# Patient Record
Sex: Male | Born: 1950 | Race: White | Hispanic: No | Marital: Married | Smoking: Never smoker
Health system: Southern US, Community
[De-identification: ages and names within clinical notes are randomized; demographics above are authoritative.]

## PROBLEM LIST (undated history)

## (undated) DIAGNOSIS — J189 Pneumonia, unspecified organism: Secondary | ICD-10-CM

## (undated) DIAGNOSIS — E119 Type 2 diabetes mellitus without complications: Secondary | ICD-10-CM

## (undated) DIAGNOSIS — Z87442 Personal history of urinary calculi: Secondary | ICD-10-CM

## (undated) DIAGNOSIS — M1990 Unspecified osteoarthritis, unspecified site: Secondary | ICD-10-CM

## (undated) DIAGNOSIS — E785 Hyperlipidemia, unspecified: Secondary | ICD-10-CM

## (undated) DIAGNOSIS — I10 Essential (primary) hypertension: Secondary | ICD-10-CM

## (undated) HISTORY — PX: TONSILLECTOMY: SUR1361

## (undated) HISTORY — PX: HERNIA REPAIR: SHX51

## (undated) HISTORY — PX: BLADDER STONE REMOVAL: SHX568

## (undated) HISTORY — PX: FRACTURE SURGERY: SHX138

## (undated) HISTORY — PX: LITHOTRIPSY: SUR834

---

## 1998-07-11 ENCOUNTER — Encounter: Payer: Self-pay | Admitting: Urology

## 1998-07-13 ENCOUNTER — Ambulatory Visit (HOSPITAL_COMMUNITY): Admission: RE | Admit: 1998-07-13 | Discharge: 1998-07-13 | Payer: Self-pay | Admitting: Urology

## 2000-05-07 ENCOUNTER — Encounter: Payer: Self-pay | Admitting: Urology

## 2000-05-08 ENCOUNTER — Ambulatory Visit (HOSPITAL_COMMUNITY): Admission: RE | Admit: 2000-05-08 | Discharge: 2000-05-08 | Payer: Self-pay | Admitting: Urology

## 2002-06-04 ENCOUNTER — Encounter: Payer: Self-pay | Admitting: Surgery

## 2002-06-04 ENCOUNTER — Ambulatory Visit (HOSPITAL_COMMUNITY): Admission: RE | Admit: 2002-06-04 | Discharge: 2002-06-04 | Payer: Self-pay | Admitting: Surgery

## 2012-11-01 DIAGNOSIS — I10 Essential (primary) hypertension: Secondary | ICD-10-CM | POA: Diagnosis present

## 2016-03-21 ENCOUNTER — Other Ambulatory Visit: Payer: Self-pay | Admitting: Urology

## 2016-04-18 ENCOUNTER — Other Ambulatory Visit: Payer: Self-pay | Admitting: Urology

## 2016-06-28 ENCOUNTER — Other Ambulatory Visit: Payer: Self-pay | Admitting: Urology

## 2016-07-02 NOTE — Patient Instructions (Addendum)
Steffanie RainwaterJames S Ebers  07/02/2016   Your procedure is scheduled on: 07-08-16  [facility]  Entrance [place]  elevators to 3rd floor to  Short Stay Center at [facility]9AM.   Call this number if you have problems the morning of surgery [phone]   Remember: ONLY 1 PERSON MAY GO WITH YOU TO SHORT STAY TO GET  READY MORNING OF YOUR SURGERY.  Do not eat food or drink liquids :After Midnight.     Take these medicines the morning of surgery with A SIP OF WATER: atorvastatin(lipitor), zetia   DO NOT TAKE ANY DIABETIC MEDICATIONS DAY OF YOUR SURGERY                               You may not have any metal on your body including hair pins and              piercings  Do not wear jewelry, make-up, lotions, powders or perfumes, deodorant                Men may shave face and neck.   Do not bring valuables to the hospital. [place] IS NOT             RESPONSIBLE   FOR VALUABLES.  Contacts, dentures or bridgework may not be worn into surgery.      Patients discharged the day of surgery will not be allowed to drive home.  Name and phone number of your driver:  Special Instructions: N/A              Please read over the following fact sheets you were given: _____________________________________________________________________           How to Manage Your Diabetes Before and After Surgery  Why is it important to control my blood sugar before and after surgery? . Improving blood sugar levels before and after surgery helps healing and can limit problems. . A way of improving blood sugar control is eating a healthy diet by: o  Eating less sugar and carbohydrates o  Increasing activity/exercise o  Talking with your doctor about reaching your blood sugar goals . High blood sugars (greater than 180 mg/dL) can raise your risk of infections and slow your recovery, so you will need to focus on controlling your diabetes during the weeks before surgery. . Make sure that  the doctor who takes care of your diabetes knows about your planned surgery including the date and location.  How do I manage my blood sugar before surgery? . Check your blood sugar at least 4 times a day, starting 2 days before surgery, to make sure that the level is not too high or low. o Check your blood sugar the morning of your surgery when you wake up and every 2 hours until you get to the Short Stay unit. . If your blood sugar is less than 70 mg/dL, you will need to treat for low blood sugar: o Do not take insulin. o Treat a low blood sugar (less than 70 mg/dL) with  cup of clear juice (cranberry or apple), 4 glucose tablets, OR glucose gel. o Recheck blood sugar in 15 minutes after treatment (to make sure it is greater than 70 mg/dL). If your blood sugar is not greater than 70 mg/dL on recheck, call [phone] for further instructions. . Report  your blood sugar to the short stay nurse when you get to Short Stay.  . If you are admitted to the hospital after surgery: o Your blood sugar will be checked by the staff and you will probably be given insulin after surgery (instead of oral diabetes medicines) to make sure you have good blood sugar levels. o The goal for blood sugar control after surgery is 80-180 mg/dL.   WHAT DO I DO ABOUT MY DIABETES MEDICATION?   . THE DAY BEFORE SURGERY, take  METFORMIN as usual       . THE MORNING OF SURGERY, Do not take oral diabetes medicines (pills)  Patient Signature:  Date:   Nurse Signature:  Date:   Reviewed and Endorsed by [facility] Patient Education Committee, August 2015  [facility] - Preparing for Surgery Before surgery, you can play an important role.  Because skin is not sterile, your skin needs to be as free of germs as possible.  You can reduce the number of germs on your skin by washing with CHG (chlorahexidine gluconate) soap before surgery.  CHG is an antiseptic cleaner which kills germs and bonds with the skin to continue  killing germs even after washing. Please DO NOT use if you have an allergy to CHG or antibacterial soaps.  If your skin becomes reddened/irritated stop using the CHG and inform your nurse when you arrive at Short Stay. Do not shave (including legs and underarms) for at least 48 hours prior to the first CHG shower.  You may shave your face/neck. Please follow these instructions carefully:  1.  Shower with CHG Soap the night before surgery and the  morning of Surgery.  2.  If you choose to wash your hair, wash your hair first as usual with your  normal  shampoo.  3.  After you shampoo, rinse your hair and body thoroughly to remove the  shampoo.                           4.  Use CHG as you would any other liquid soap.  You can apply chg directly  to the skin and wash                       Gently with a scrungie or clean washcloth.  5.  Apply the CHG Soap to your body ONLY FROM THE NECK DOWN.   Do not use on face/ open                           Wound or open sores. Avoid contact with eyes, ears mouth and genitals (private parts).                       Wash face,  Genitals (private parts) with your normal soap.             6.  Wash thoroughly, paying special attention to the area where your surgery  will be performed.  7.  Thoroughly rinse your body with warm water from the neck down.  8.  DO NOT shower/wash with your normal soap after using and rinsing off  the CHG Soap.                9.  Pat yourself dry with a clean towel.            10.  Wear  clean pajamas.            11.  Place clean sheets on your bed the night of your first shower and do not  sleep with pets. Day of Surgery : Do not apply any lotions/deodorants the morning of surgery.  Please wear clean clothes to the hospital/surgery center.  FAILURE TO FOLLOW THESE INSTRUCTIONS MAY RESULT IN THE CANCELLATION OF YOUR SURGERY   _______________________________________________________________________

## 2016-07-02 NOTE — Progress Notes (Signed)
LOV Zoe LanPenny Jones FNP 11-27-15 on chart mentions "hx of stones"..."F/U urology "

## 2016-07-03 ENCOUNTER — Encounter (HOSPITAL_COMMUNITY)
Admission: RE | Admit: 2016-07-03 | Discharge: 2016-07-03 | Disposition: A | Discharge status: Home / Self Care | Payer: PRIVATE HEALTH INSURANCE | Source: Ambulatory Visit | Attending: Urology | Admitting: Urology

## 2016-07-03 ENCOUNTER — Encounter (HOSPITAL_COMMUNITY): Payer: Self-pay

## 2016-07-03 DIAGNOSIS — Z01810 Encounter for preprocedural cardiovascular examination: Secondary | ICD-10-CM | POA: Diagnosis present

## 2016-07-03 DIAGNOSIS — I10 Essential (primary) hypertension: Secondary | ICD-10-CM | POA: Diagnosis not present

## 2016-07-03 DIAGNOSIS — Z01812 Encounter for preprocedural laboratory examination: Secondary | ICD-10-CM | POA: Insufficient documentation

## 2016-07-03 HISTORY — DX: Type 2 diabetes mellitus without complications: E11.9

## 2016-07-03 HISTORY — DX: Unspecified osteoarthritis, unspecified site: M19.90

## 2016-07-03 HISTORY — DX: Hyperlipidemia, unspecified: E78.5

## 2016-07-03 HISTORY — DX: Essential (primary) hypertension: I10

## 2016-07-03 HISTORY — DX: Personal history of urinary calculi: Z87.442

## 2016-07-03 HISTORY — DX: Pneumonia, unspecified organism: J18.9

## 2016-07-03 LAB — BASIC METABOLIC PANEL
Anion gap: 10 (ref 5–15)
BUN: 23 mg/dL — AB (ref 6–20)
CHLORIDE: 105 mmol/L (ref 101–111)
CO2: 24 mmol/L (ref 22–32)
Calcium: 9.5 mg/dL (ref 8.9–10.3)
Creatinine, Ser: 1.14 mg/dL (ref 0.61–1.24)
GFR calc Af Amer: >60 mL/min (ref >60)
GFR calc non Af Amer: >60 mL/min (ref >60)
GLUCOSE: 140 mg/dL — AB (ref 65–99)
Potassium: 4.5 mmol/L (ref 3.5–5.1)
Sodium: 139 mmol/L (ref 135–145)

## 2016-07-03 LAB — CBC
HEMATOCRIT: 44.5 % (ref 39.0–52.0)
Hemoglobin: 16.1 g/dL (ref 13.0–17.0)
MCH: 34.3 pg — ABNORMAL HIGH (ref 26.0–34.0)
MCHC: 36.2 g/dL — AB (ref 30.0–36.0)
MCV: 94.7 fL (ref 78.0–100.0)
Platelets: 174 10*3/uL (ref 150–400)
RBC: 4.7 MIL/uL (ref 4.22–5.81)
RDW: 12.6 % (ref 11.5–15.5)
WBC: 8.2 10*3/uL (ref 4.0–10.5)

## 2016-07-03 LAB — GLUCOSE, CAPILLARY: Glucose-Capillary: 157 mg/dL — ABNORMAL HIGH (ref 65–99)

## 2016-07-04 LAB — HEMOGLOBIN A1C
HEMOGLOBIN A1C: 7.1 % — AB (ref 4.8–5.6)
MEAN PLASMA GLUCOSE: 157 mg/dL

## 2016-07-08 ENCOUNTER — Ambulatory Visit (HOSPITAL_COMMUNITY): Payer: No Typology Code available for payment source

## 2016-07-08 ENCOUNTER — Encounter (HOSPITAL_COMMUNITY): Payer: Self-pay | Admitting: *Deleted

## 2016-07-08 ENCOUNTER — Encounter (HOSPITAL_COMMUNITY)
Admission: RE | Disposition: A | Discharge status: Home / Self Care | Payer: Self-pay | Source: Ambulatory Visit | Attending: Urology

## 2016-07-08 ENCOUNTER — Ambulatory Visit (HOSPITAL_COMMUNITY): Payer: No Typology Code available for payment source | Admitting: Anesthesiology

## 2016-07-08 ENCOUNTER — Observation Stay (HOSPITAL_COMMUNITY)
Admission: RE | Admit: 2016-07-08 | Discharge: 2016-07-09 | Disposition: A | Discharge status: Home / Self Care | Payer: No Typology Code available for payment source | Source: Ambulatory Visit | Attending: Urology | Admitting: Urology

## 2016-07-08 DIAGNOSIS — Z7982 Long term (current) use of aspirin: Secondary | ICD-10-CM | POA: Insufficient documentation

## 2016-07-08 DIAGNOSIS — M1990 Unspecified osteoarthritis, unspecified site: Secondary | ICD-10-CM | POA: Insufficient documentation

## 2016-07-08 DIAGNOSIS — I10 Essential (primary) hypertension: Secondary | ICD-10-CM | POA: Diagnosis not present

## 2016-07-08 DIAGNOSIS — N200 Calculus of kidney: Secondary | ICD-10-CM | POA: Diagnosis present

## 2016-07-08 DIAGNOSIS — Z79899 Other long term (current) drug therapy: Secondary | ICD-10-CM | POA: Diagnosis not present

## 2016-07-08 DIAGNOSIS — E785 Hyperlipidemia, unspecified: Secondary | ICD-10-CM | POA: Insufficient documentation

## 2016-07-08 DIAGNOSIS — E669 Obesity, unspecified: Secondary | ICD-10-CM | POA: Diagnosis not present

## 2016-07-08 DIAGNOSIS — Z87442 Personal history of urinary calculi: Secondary | ICD-10-CM | POA: Diagnosis not present

## 2016-07-08 DIAGNOSIS — E119 Type 2 diabetes mellitus without complications: Secondary | ICD-10-CM | POA: Insufficient documentation

## 2016-07-08 DIAGNOSIS — Z7984 Long term (current) use of oral hypoglycemic drugs: Secondary | ICD-10-CM | POA: Insufficient documentation

## 2016-07-08 DIAGNOSIS — Z6830 Body mass index (BMI) 30.0-30.9, adult: Secondary | ICD-10-CM | POA: Diagnosis not present

## 2016-07-08 HISTORY — PX: CYSTOSCOPY WITH STENT PLACEMENT: SHX5790

## 2016-07-08 HISTORY — PX: NEPHROLITHOTOMY: SHX5134

## 2016-07-08 LAB — BASIC METABOLIC PANEL
Anion gap: 6 (ref 5–15)
BUN: 28 mg/dL — AB (ref 6–20)
CALCIUM: 8.6 mg/dL — AB (ref 8.9–10.3)
CHLORIDE: 106 mmol/L (ref 101–111)
CO2: 24 mmol/L (ref 22–32)
CREATININE: 1.33 mg/dL — AB (ref 0.61–1.24)
GFR calc non Af Amer: 55 mL/min — ABNORMAL LOW (ref >60)
Glucose, Bld: 174 mg/dL — ABNORMAL HIGH (ref 65–99)
Potassium: 4.4 mmol/L (ref 3.5–5.1)
SODIUM: 136 mmol/L (ref 135–145)

## 2016-07-08 LAB — GLUCOSE, CAPILLARY
GLUCOSE-CAPILLARY: 141 mg/dL — AB (ref 65–99)
Glucose-Capillary: 157 mg/dL — ABNORMAL HIGH (ref 65–99)

## 2016-07-08 LAB — CBC
HCT: 35.7 % — ABNORMAL LOW (ref 39.0–52.0)
HEMOGLOBIN: 12.6 g/dL — AB (ref 13.0–17.0)
MCH: 33.7 pg (ref 26.0–34.0)
MCHC: 35.3 g/dL (ref 30.0–36.0)
MCV: 95.5 fL (ref 78.0–100.0)
PLATELETS: 172 10*3/uL (ref 150–400)
RBC: 3.74 MIL/uL — ABNORMAL LOW (ref 4.22–5.81)
RDW: 12.8 % (ref 11.5–15.5)
WBC: 15 10*3/uL — ABNORMAL HIGH (ref 4.0–10.5)

## 2016-07-08 SURGERY — NEPHROLITHOTOMY PERCUTANEOUS
Anesthesia: General | Site: Urethra

## 2016-07-08 MED ORDER — CEFAZOLIN SODIUM-DEXTROSE 2-4 GM/100ML-% IV SOLN
2.0000 g | Freq: Three times a day (TID) | INTRAVENOUS | Status: AC
  Administered 2016-07-08 – 2016-07-09 (×2): 2 g via INTRAVENOUS
  Filled 2016-07-08 (×2): qty 100

## 2016-07-08 MED ORDER — LIDOCAINE 2% (20 MG/ML) 5 ML SYRINGE
INTRAMUSCULAR | Status: AC
  Filled 2016-07-08: qty 5

## 2016-07-08 MED ORDER — MIDAZOLAM HCL 2 MG/2ML IJ SOLN
INTRAMUSCULAR | Status: AC
  Filled 2016-07-08: qty 2

## 2016-07-08 MED ORDER — SODIUM CHLORIDE 0.9 % IR SOLN
Status: DC | PRN
  Administered 2016-07-08: 15000 mL

## 2016-07-08 MED ORDER — SODIUM CHLORIDE 0.9 % IV SOLN
INTRAVENOUS | Status: DC
  Administered 2016-07-08 – 2016-07-09 (×2): via INTRAVENOUS

## 2016-07-08 MED ORDER — HYDROMORPHONE HCL 1 MG/ML IJ SOLN
0.2500 mg | INTRAMUSCULAR | Status: DC | PRN
  Administered 2016-07-08 (×2): 0.5 mg via INTRAVENOUS

## 2016-07-08 MED ORDER — ZOLPIDEM TARTRATE 5 MG PO TABS
5.0000 mg | ORAL_TABLET | Freq: Every evening | ORAL | Status: DC | PRN
  Administered 2016-07-09: 5 mg via ORAL
  Filled 2016-07-08: qty 1

## 2016-07-08 MED ORDER — SUGAMMADEX SODIUM 200 MG/2ML IV SOLN
INTRAVENOUS | Status: DC | PRN
  Administered 2016-07-08: 200 mg via INTRAVENOUS

## 2016-07-08 MED ORDER — ONDANSETRON HCL 4 MG/2ML IJ SOLN
4.0000 mg | INTRAMUSCULAR | Status: DC | PRN
  Administered 2016-07-08 – 2016-07-09 (×2): 4 mg via INTRAVENOUS
  Filled 2016-07-08 (×2): qty 2

## 2016-07-08 MED ORDER — ATORVASTATIN CALCIUM 40 MG PO TABS
40.0000 mg | ORAL_TABLET | Freq: Every day | ORAL | Status: DC
  Filled 2016-07-08: qty 2

## 2016-07-08 MED ORDER — ONDANSETRON HCL 4 MG/2ML IJ SOLN
INTRAMUSCULAR | Status: DC | PRN
  Administered 2016-07-08: 4 mg via INTRAVENOUS

## 2016-07-08 MED ORDER — DIPHENHYDRAMINE HCL 50 MG/ML IJ SOLN: 12.5000 mg | Freq: Four times a day (QID) | INTRAMUSCULAR | Status: DC | PRN

## 2016-07-08 MED ORDER — PHENYLEPHRINE 40 MCG/ML (10ML) SYRINGE FOR IV PUSH (FOR BLOOD PRESSURE SUPPORT)
PREFILLED_SYRINGE | INTRAVENOUS | Status: AC
  Filled 2016-07-08: qty 10

## 2016-07-08 MED ORDER — EZETIMIBE 10 MG PO TABS
10.0000 mg | ORAL_TABLET | Freq: Every day | ORAL | Status: DC
  Filled 2016-07-08: qty 1

## 2016-07-08 MED ORDER — FENTANYL CITRATE (PF) 250 MCG/5ML IJ SOLN
INTRAMUSCULAR | Status: AC
  Filled 2016-07-08: qty 5

## 2016-07-08 MED ORDER — HYDROMORPHONE HCL 1 MG/ML IJ SOLN
0.5000 mg | INTRAMUSCULAR | Status: DC | PRN
  Administered 2016-07-08 – 2016-07-09 (×3): 1 mg via INTRAVENOUS
  Filled 2016-07-08 (×4): qty 1

## 2016-07-08 MED ORDER — BELLADONNA ALKALOIDS-OPIUM 16.2-60 MG RE SUPP
1.0000 | Freq: Four times a day (QID) | RECTAL | Status: DC | PRN
  Administered 2016-07-08: 1 via RECTAL
  Filled 2016-07-08: qty 1

## 2016-07-08 MED ORDER — OXYCODONE-ACETAMINOPHEN 5-325 MG PO TABS
1.0000 | ORAL_TABLET | ORAL | Status: DC | PRN
  Administered 2016-07-09 (×2): 2 via ORAL
  Filled 2016-07-08 (×2): qty 2

## 2016-07-08 MED ORDER — MIDAZOLAM HCL 5 MG/5ML IJ SOLN
INTRAMUSCULAR | Status: DC | PRN
  Administered 2016-07-08: 2 mg via INTRAVENOUS

## 2016-07-08 MED ORDER — ONDANSETRON HCL 4 MG/2ML IJ SOLN
INTRAMUSCULAR | Status: AC
  Filled 2016-07-08: qty 2

## 2016-07-08 MED ORDER — PROPOFOL 10 MG/ML IV BOLUS
INTRAVENOUS | Status: DC | PRN
  Administered 2016-07-08: 50 mg via INTRAVENOUS
  Administered 2016-07-08: 150 mg via INTRAVENOUS

## 2016-07-08 MED ORDER — PHENYLEPHRINE HCL 10 MG/ML IJ SOLN
INTRAMUSCULAR | Status: DC | PRN
  Administered 2016-07-08: 40 ug via INTRAVENOUS
  Administered 2016-07-08: 80 ug via INTRAVENOUS
  Administered 2016-07-08: 40 ug via INTRAVENOUS
  Administered 2016-07-08 (×2): 80 ug via INTRAVENOUS
  Administered 2016-07-08: 120 ug via INTRAVENOUS

## 2016-07-08 MED ORDER — PROMETHAZINE HCL 25 MG/ML IJ SOLN: 6.2500 mg | INTRAMUSCULAR | Status: DC | PRN

## 2016-07-08 MED ORDER — DIPHENHYDRAMINE HCL 12.5 MG/5ML PO ELIX: 12.5000 mg | ORAL_SOLUTION | Freq: Four times a day (QID) | ORAL | Status: DC | PRN

## 2016-07-08 MED ORDER — SUGAMMADEX SODIUM 200 MG/2ML IV SOLN
INTRAVENOUS | Status: AC
  Filled 2016-07-08: qty 2

## 2016-07-08 MED ORDER — HYDROMORPHONE HCL 2 MG/ML IJ SOLN
INTRAMUSCULAR | Status: AC
  Filled 2016-07-08: qty 1

## 2016-07-08 MED ORDER — HYDROMORPHONE HCL 1 MG/ML IJ SOLN
INTRAMUSCULAR | Status: AC
  Filled 2016-07-08: qty 0.5

## 2016-07-08 MED ORDER — HYDROMORPHONE HCL 1 MG/ML IJ SOLN
INTRAMUSCULAR | Status: DC | PRN
  Administered 2016-07-08: 1 mg via INTRAVENOUS

## 2016-07-08 MED ORDER — LIDOCAINE HCL (CARDIAC) 20 MG/ML IV SOLN
INTRAVENOUS | Status: DC | PRN
  Administered 2016-07-08: 100 mg via INTRAVENOUS

## 2016-07-08 MED ORDER — HYDROMORPHONE HCL 1 MG/ML IJ SOLN
INTRAMUSCULAR | Status: AC
Start: 2016-07-08 — End: 2016-07-09
  Filled 2016-07-08: qty 1

## 2016-07-08 MED ORDER — PROPOFOL 10 MG/ML IV BOLUS
INTRAVENOUS | Status: AC
  Filled 2016-07-08: qty 20

## 2016-07-08 MED ORDER — OXYCODONE HCL 5 MG PO TABS: 5.0000 mg | ORAL_TABLET | Freq: Once | ORAL | Status: DC | PRN

## 2016-07-08 MED ORDER — FENTANYL CITRATE (PF) 100 MCG/2ML IJ SOLN
INTRAMUSCULAR | Status: DC | PRN
  Administered 2016-07-08: 100 ug via INTRAVENOUS
  Administered 2016-07-08 (×3): 50 ug via INTRAVENOUS

## 2016-07-08 MED ORDER — CEFAZOLIN SODIUM-DEXTROSE 2-4 GM/100ML-% IV SOLN
2.0000 g | INTRAVENOUS | Status: AC
  Administered 2016-07-08: 2 g via INTRAVENOUS
  Filled 2016-07-08: qty 100

## 2016-07-08 MED ORDER — ROCURONIUM BROMIDE 50 MG/5ML IV SOSY
PREFILLED_SYRINGE | INTRAVENOUS | Status: AC
  Filled 2016-07-08: qty 5

## 2016-07-08 MED ORDER — SODIUM CHLORIDE 0.9 % IV SOLN
INTRAVENOUS | Status: DC | PRN
  Administered 2016-07-08: 37 mL

## 2016-07-08 MED ORDER — OXYCODONE HCL 5 MG/5ML PO SOLN
5.0000 mg | Freq: Once | ORAL | Status: DC | PRN
  Filled 2016-07-08: qty 5

## 2016-07-08 MED ORDER — ROCURONIUM BROMIDE 100 MG/10ML IV SOLN
INTRAVENOUS | Status: DC | PRN
  Administered 2016-07-08 (×2): 10 mg via INTRAVENOUS
  Administered 2016-07-08: 50 mg via INTRAVENOUS

## 2016-07-08 MED ORDER — LACTATED RINGERS IV SOLN
INTRAVENOUS | Status: DC
  Administered 2016-07-08 (×3): via INTRAVENOUS

## 2016-07-08 SURGICAL SUPPLY — 65 items
BAG URINE DRAINAGE (UROLOGICAL SUPPLIES) ×10 IMPLANT
BAG URO CATCHER STRL LF (MISCELLANEOUS) ×5 IMPLANT
BASKET STONE NITINOL 3FRX115MB (UROLOGICAL SUPPLIES) IMPLANT
BASKET ZERO TIP NITINOL 2.4FR (BASKET) IMPLANT
BENZOIN TINCTURE PRP APPL 2/3 (GAUZE/BANDAGES/DRESSINGS) ×10 IMPLANT
BLADE SURG 15 STRL LF DISP TIS (BLADE) ×3 IMPLANT
BLADE SURG 15 STRL SS (BLADE) ×2
CATH FOLEY 2W COUNCIL 20FR 5CC (CATHETERS) ×5 IMPLANT
CATH FOLEY 2WAY SLVR  5CC 16FR (CATHETERS) ×2
CATH FOLEY 2WAY SLVR  5CC 18FR (CATHETERS) ×2
CATH FOLEY 2WAY SLVR 5CC 16FR (CATHETERS) ×3 IMPLANT
CATH FOLEY 2WAY SLVR 5CC 18FR (CATHETERS) ×3 IMPLANT
CATH IMAGER II 65CM (CATHETERS) ×5 IMPLANT
CATH INTERMIT  6FR 70CM (CATHETERS) ×5 IMPLANT
CATH ROBINSON RED A/P 20FR (CATHETERS) IMPLANT
CATH URET DUAL LUMEN 6-10FR 50 (CATHETERS) IMPLANT
CATH X-FORCE N30 NEPHROSTOMY (TUBING) ×5 IMPLANT
CLOTH BEACON ORANGE TIMEOUT ST (SAFETY) ×5 IMPLANT
COVER SURGICAL LIGHT HANDLE (MISCELLANEOUS) ×5 IMPLANT
DRAPE C-ARM 42X120 X-RAY (DRAPES) ×5 IMPLANT
DRAPE LINGEMAN PERC (DRAPES) ×5 IMPLANT
DRAPE SHEET LG 3/4 BI-LAMINATE (DRAPES) ×5 IMPLANT
DRAPE SURG IRRIG POUCH 19X23 (DRAPES) ×5 IMPLANT
DRSG PAD ABDOMINAL 8X10 ST (GAUZE/BANDAGES/DRESSINGS) ×10 IMPLANT
DRSG TEGADERM 8X12 (GAUZE/BANDAGES/DRESSINGS) ×10 IMPLANT
FIBER LASER FLEXIVA 1000 (UROLOGICAL SUPPLIES) IMPLANT
FIBER LASER FLEXIVA 365 (UROLOGICAL SUPPLIES) IMPLANT
FIBER LASER FLEXIVA 550 (UROLOGICAL SUPPLIES) IMPLANT
FIBER LASER TRAC TIP (UROLOGICAL SUPPLIES) IMPLANT
GAUZE SPONGE 4X4 12PLY STRL (GAUZE/BANDAGES/DRESSINGS) ×5 IMPLANT
GLOVE BIO SURGEON STRL SZ8 (GLOVE) ×15 IMPLANT
GLOVE BIOGEL M 8.0 STRL (GLOVE) ×5 IMPLANT
GLOVE BIOGEL PI IND STRL 8 (GLOVE) IMPLANT
GLOVE BIOGEL PI INDICATOR 8 (GLOVE)
GOWN STRL REUS W/TWL LRG LVL3 (GOWN DISPOSABLE) ×10 IMPLANT
GOWN STRL REUS W/TWL XL LVL3 (GOWN DISPOSABLE) ×5 IMPLANT
GUIDEWIRE AMPLAZ .035X145 (WIRE) ×5 IMPLANT
GUIDEWIRE STR DUAL SENSOR (WIRE) ×10 IMPLANT
HOLDER NEEDLE AMPLATZ W/INSERT (MISCELLANEOUS) IMPLANT
IV SET EXTENSION CATH 6 NF (IV SETS) ×5 IMPLANT
KIT BASIN OR (CUSTOM PROCEDURE TRAY) ×5 IMPLANT
MANIFOLD NEPTUNE II (INSTRUMENTS) ×5 IMPLANT
NEEDLE TROCAR 18X15 ECHO (NEEDLE) ×5 IMPLANT
NEEDLE TROCAR 18X20 (NEEDLE) ×5 IMPLANT
NS IRRIG 1000ML POUR BTL (IV SOLUTION) ×5 IMPLANT
PACK CYSTO (CUSTOM PROCEDURE TRAY) ×10 IMPLANT
PROBE LITHOCLAST ULTRA 3.8X403 (UROLOGICAL SUPPLIES) ×5 IMPLANT
PROBE PNEUMATIC 1.0MMX570MM (UROLOGICAL SUPPLIES) ×5 IMPLANT
SET AMPLATZ RENAL DILATOR (MISCELLANEOUS) IMPLANT
SET IRRIG Y TYPE TUR BLADDER L (SET/KITS/TRAYS/PACK) ×10 IMPLANT
SHEATH PEELAWAY SET 9 (SHEATH) ×5 IMPLANT
SPONGE LAP 4X18 X RAY DECT (DISPOSABLE) ×5 IMPLANT
STENT CONTOUR 6FRX26X.038 (STENTS) IMPLANT
STENT URET 6FRX26 CONTOUR (STENTS) ×5 IMPLANT
STONE CATCHER W/TUBE ADAPTER (UROLOGICAL SUPPLIES) IMPLANT
SUT SILK 2 0 30  PSL (SUTURE) ×2
SUT SILK 2 0 30 PSL (SUTURE) ×3 IMPLANT
SUT SILK 2 0 SH (SUTURE) ×10 IMPLANT
SYR 10ML LL (SYRINGE) ×5 IMPLANT
SYR 20CC LL (SYRINGE) ×10 IMPLANT
SYR 50ML LL SCALE MARK (SYRINGE) ×5 IMPLANT
TOWEL OR 17X26 10 PK STRL BLUE (TOWEL DISPOSABLE) ×5 IMPLANT
TUBING CONNECTING 10 (TUBING) ×8 IMPLANT
TUBING CONNECTING 10' (TUBING) ×2
WATER STERILE IRR 1500ML POUR (IV SOLUTION) ×5 IMPLANT

## 2016-07-08 NOTE — Op Note (Signed)
Marland Kitchen.Preoperative diagnosis: Left renal stone  Postoperative diagnosis: Same  Procedure 1.  Left percutaneous nephrostolithotomy for stone greater than 2 cm 2.  Left nephrostogram 3.  Intraoperative fluoroscopy, under 1 hour, with interpretation 4.  Placement of a 6 x 26 double-J ureteral stent. 5.  Percutaneous access into the left renal collecting system 6.  Placement of a 20 French nephrostomy tube 7.  Dilation of percutaneous tract  Attending: Dr. Ronne BinningMckenzie  Anesthesia: General  Estimated blood loss: 400cc  Antibiotics: ancef  Drains: 1.  16 French Foley catheter 2.  6 x 26 left double-J ureteral stent 3.  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  After discussing treatment options and they decided to proceed with left percutaneous nephrostolithotomy.    Procedure in detail: Prior to procedure consent was obtained.  Patient was brought to the operating room and a timeout was done to ensure correct patient, correct procedure, and correct site.  General anesthesia was administered. The patients genetalia was prepped and draped. A flexible cystoscope was passed into the urethra and then the bladder. No masses or lesions were seen in the bladder. Ureteral orifices were in the normal anatomic location. A sensor wire was passed into the left ureter and up to the renal pelvis. A 6 french ureteral catheter was advanced over the wire and up to the renal pelvis the wire was then removed and so was the cystoscope.  A 16 French Foley catheter was in place and the ureteral catheter was secured with a 0 silk tie.  The patient was then placed in the prone position.   The left flank was then prepped and draped in usual sterile fashion.  Contrast was instilled throught the ureteral catheter and the bullseye technique was used to gain access into  the lower pole. Once we gained access a sensor wire was advanced through the spinal needle and down the ureter to the bladder. We then used an 8 french and 10 french dilater to dilate the tract. We then used a sheath to place a second wire down to the bladder.  We then made an incision at the level of the skin and over the wire we then placed a NephroMax dilator.  We dilated the nephrostomy tract to 30 [place]French and held this 18 cm of water for 1 minute.  We then  placed the access sheath over the balloon.  The balloon was then deflated.  We then used a rigid nephroscope to perform nephroscopy.  We encountered a large lower pole stone. Using the LithoClast the stone was fragmented and the fragments were removed with graspers.  The stone fragments were sent for composition analysis.  We then removed the nephroscope and over the wire placed a 6 x 26 double-J ureteral stent.  The wire was then removed and good coil was noted in the renal pelvis under direct vision in the bladder under fluoroscopy.  We then placed a 20 French nephrostomy tube through the sheath into the renal pelvis.  The balloon was inflated with 3 mls of contrast.  We then removed the access sheath in and obtained another nephrostogram.  We noted minimal extravasation of contrast.  We then secured the nephrostomy tube with 0 silks in interrupted fashion.  Dressing was placed over the nephrostomy tube site and this then concluded the procedure was well-tolerated by the patient.  Complications: None  Condition: Stable, extubated, transferred to PACU  Plan: Patient is to be admitted overnight for observation.  Foley catheter through the morning.  Pt is then to be discharged home and followup in 1 week for nephrostomy tube removal. Stent to be removed 1 week after nephrostomy tube

## 2016-07-08 NOTE — H&P (Signed)
Urology Admission H&P  Chief Complaint: left flank pain  History of Present Illness: Keith Ortiz is a 65yo with left staghorn calculus.  Past Medical History:  Diagnosis Date  . Arthritis   . Diabetes mellitus without complication (HCC)    type 2  . History of kidney stones   . HLD (hyperlipidemia)   . Hypertension   . Pneumonia    Past Surgical History:  Procedure Laterality Date  . BLADDER STONE REMOVAL    . FRACTURE SURGERY    . HERNIA REPAIR    . LITHOTRIPSY    . TONSILLECTOMY      Home Medications:  Prescriptions Prior to Admission  Medication Sig Dispense Refill Last Dose  . atorvastatin (LIPITOR) 40 MG tablet Take 40 mg by mouth daily.  0 07/08/2016 at 0700  . ezetimibe (ZETIA) 10 MG tablet Take 10 mg by mouth daily.  0 07/08/2016 at 0700  . metFORMIN (GLUCOPHAGE-XR) 500 MG 24 hr tablet Take 500 mg by mouth daily.  0 07/07/2016 at Unknown time  . valsartan-hydrochlorothiazide (DIOVAN-HCT) 160-12.5 MG tablet Take 1 tablet by mouth daily.  0 07/07/2016 at Unknown time  . aspirin EC 81 MG tablet Take 81 mg by mouth daily.   about 2 weeks ago   Allergies: No Known Allergies  History reviewed. No pertinent family history. Social History:  reports that he has never smoked. He has never used smokeless tobacco. He reports that he drinks alcohol. He reports that he does not use drugs.  Review of Systems  Genitourinary: Positive for flank pain.  All other systems reviewed and are negative.   Physical Exam:  Vital signs in last 24 hours: Temp:  [98.2 F (36.8 C)] 98.2 F (36.8 C) (06/25 0854) Pulse Rate:  [86] 86 (06/25 0854) Resp:  [18] 18 (06/25 0854) BP: (133)/(85) 133/85 (06/25 0854) SpO2:  [97 %] 97 % (06/25 0854) Weight:  [100.7 kg (222 lb)] 100.7 kg (222 lb) (06/25 0908) Physical Exam  Constitutional: He is oriented to person, place, and time. He appears well-developed and well-nourished.  HENT:  Head: Normocephalic and atraumatic.  Eyes: EOM are normal. Pupils  are equal, round, and reactive to light.  Neck: Normal range of motion. No thyromegaly present.  Cardiovascular: Normal rate and regular rhythm.   Respiratory: Effort normal. No respiratory distress.  GI: Soft. He exhibits no distension.  Musculoskeletal: Normal range of motion. He exhibits no edema.  Neurological: He is alert and oriented to person, place, and time.  Skin: Skin is warm and dry.  Psychiatric: He has a normal mood and affect. His behavior is normal. Judgment and thought content normal.    Laboratory Data:  Results for orders placed or performed during the hospital encounter of 07/08/16 (from the past 24 hour(s))  Glucose, capillary     Status: Abnormal   Collection Time: 07/08/16  8:55 AM  Result Value Ref Range   Glucose-Capillary 157 (H) 65 - 99 mg/dL   No results found for this or any previous visit (from the past 240 hour(s)). Creatinine:  Recent Labs  07/03/16 1110  CREATININE 1.14   Baseline Creatinine: 1.1  Impression/Assessment:  65yo with left staghorn calculus  Plan:  The risks/benefits/alternatives to L PCNL was explained to the patient and he understands and wishes to proceed with surgery  Keith Ortiz 07/08/2016, 10:56 AM      

## 2016-07-08 NOTE — Anesthesia Preprocedure Evaluation (Addendum)
Anesthesia Evaluation  Patient identified by MRN, date of birth, ID band Patient awake    Reviewed: Allergy & Precautions, NPO status , Patient's Chart, lab work & pertinent test results  Airway Mallampati: II  TM Distance: >3 FB Neck ROM: Full    Dental no notable dental hx.    Pulmonary neg pulmonary ROS,    Pulmonary exam normal breath sounds clear to auscultation       Cardiovascular hypertension, Pt. on medications negative cardio ROS Normal cardiovascular exam Rhythm:Regular Rate:Normal     Neuro/Psych negative neurological ROS  negative psych ROS   GI/Hepatic negative GI ROS, Neg liver ROS,   Endo/Other  negative endocrine ROSdiabetes, Type 2, Oral Hypoglycemic Agents  Renal/GU negative Renal ROS  negative genitourinary   Musculoskeletal negative musculoskeletal ROS (+) Arthritis ,   Abdominal (+) + obese,   Peds negative pediatric ROS (+)  Hematology negative hematology ROS (+)   Anesthesia Other Findings   Reproductive/Obstetrics negative OB ROS                             Anesthesia Physical Anesthesia Plan  ASA: II  Anesthesia Plan: General   Post-op Pain Management:    Induction: Intravenous  PONV Risk Score and Plan: 4 or greater and Ondansetron, Dexamethasone, Propofol, Midazolam and Treatment may vary due to age or medical condition  Airway Management Planned: LMA and Oral ETT  Additional Equipment:   Intra-op Plan:   Post-operative Plan: Extubation in OR  Informed Consent: I have reviewed the patients History and Physical, chart, labs and discussed the procedure including the risks, benefits and alternatives for the proposed anesthesia with the patient or authorized representative who has indicated his/her understanding and acceptance.   Dental advisory given  Plan Discussed with: CRNA  Anesthesia Plan Comments:         Anesthesia Quick  Evaluation

## 2016-07-08 NOTE — Anesthesia Postprocedure Evaluation (Signed)
Anesthesia Post Note  Patient: Keith RainwaterJames S Ortiz  Procedure(s) Performed: Procedure(s) (LRB): NEPHROLITHOTOMY PERCUTANEOUS (Left) CYSTOSCOPY WITH STENT PLACEMENT (Left) HOLMIUM LASER APPLICATION (N/A)     Patient location during evaluation: PACU Anesthesia Type: General Level of consciousness: awake and alert Pain management: pain level controlled Vital Signs Assessment: post-procedure vital signs reviewed and stable Respiratory status: spontaneous breathing, nonlabored ventilation, respiratory function stable and patient connected to nasal cannula oxygen Cardiovascular status: blood pressure returned to baseline and stable Postop Assessment: no signs of nausea or vomiting Anesthetic complications: no    Last Vitals:  Vitals:   07/08/16 1455 07/08/16 1500  BP:  108/68  Pulse: (!) 53 (!) 57  Resp: 10 10  Temp:  36.4 C    Last Pain:  Vitals:   07/08/16 1455  TempSrc:   PainSc: 4                  Kennieth RadFitzgerald, Zeppelin Commisso E

## 2016-07-08 NOTE — Transfer of Care (Signed)
Immediate Anesthesia Transfer of Care Note  Patient: Keith Ortiz  Procedure(s) Performed: Procedure(s): NEPHROLITHOTOMY PERCUTANEOUS (Left) CYSTOSCOPY WITH STENT PLACEMENT (Left) HOLMIUM LASER APPLICATION (N/A)  Patient Location: PACU  Anesthesia Type:General  Level of Consciousness: awake, alert  and oriented  Airway & Oxygen Therapy: Patient Spontanous Breathing and Patient connected to face mask oxygen  Post-op Assessment: Report given to RN and Post -op Vital signs reviewed and stable  Post vital signs: Reviewed and stable  Last Vitals:  Vitals:   07/08/16 0854  BP: 133/85  Pulse: 86  Resp: 18  Temp: 36.8 C    Last Pain:  Vitals:   07/08/16 0854  TempSrc: Oral      Patients Stated Pain Goal: 5 (07/08/16 0908)  Complications: No apparent anesthesia complications

## 2016-07-08 NOTE — Anesthesia Procedure Notes (Signed)
Procedure Name: Intubation Date/Time: 07/08/2016 11:34 AM Performed by: Thornell MuleSTUBBLEFIELD, Vernell Back G Pre-anesthesia Checklist: Patient identified, Emergency Drugs available, Suction available and Patient being monitored Patient Re-evaluated:Patient Re-evaluated prior to inductionOxygen Delivery Method: Circle system utilized Preoxygenation: Pre-oxygenation with 100% oxygen Intubation Type: IV induction Ventilation: Mask ventilation without difficulty Laryngoscope Size: Miller and 3 Grade View: Grade II Tube type: Oral Tube size: 7.5 mm Number of attempts: 1 Airway Equipment and Method: Stylet and Oral airway Placement Confirmation: ETT inserted through vocal cords under direct vision,  positive ETCO2 and breath sounds checked- equal and bilateral Secured at: 21 cm Tube secured with: Tape Dental Injury: Teeth and Oropharynx as per pre-operative assessment

## 2016-07-09 ENCOUNTER — Ambulatory Visit: Admit: 2016-07-09 | Payer: Self-pay | Admitting: Urology

## 2016-07-09 DIAGNOSIS — N200 Calculus of kidney: Secondary | ICD-10-CM | POA: Diagnosis not present

## 2016-07-09 LAB — CBC
HEMATOCRIT: 36.5 % — AB (ref 39.0–52.0)
Hemoglobin: 12.9 g/dL — ABNORMAL LOW (ref 13.0–17.0)
MCH: 33.1 pg (ref 26.0–34.0)
MCHC: 35.3 g/dL (ref 30.0–36.0)
MCV: 93.6 fL (ref 78.0–100.0)
PLATELETS: 163 10*3/uL (ref 150–400)
RBC: 3.9 MIL/uL — AB (ref 4.22–5.81)
RDW: 12.7 % (ref 11.5–15.5)
WBC: 14.8 10*3/uL — AB (ref 4.0–10.5)

## 2016-07-09 LAB — BASIC METABOLIC PANEL
ANION GAP: 8 (ref 5–15)
BUN: 17 mg/dL (ref 6–20)
CHLORIDE: 105 mmol/L (ref 101–111)
CO2: 25 mmol/L (ref 22–32)
Calcium: 8.5 mg/dL — ABNORMAL LOW (ref 8.9–10.3)
Creatinine, Ser: 1.17 mg/dL (ref 0.61–1.24)
GFR calc Af Amer: >60 mL/min (ref >60)
GFR calc non Af Amer: >60 mL/min (ref >60)
GLUCOSE: 158 mg/dL — AB (ref 65–99)
POTASSIUM: 4 mmol/L (ref 3.5–5.1)
Sodium: 138 mmol/L (ref 135–145)

## 2016-07-09 SURGERY — NEPHROLITHOTOMY PERCUTANEOUS
Anesthesia: General | Laterality: Left

## 2016-07-09 MED ORDER — OXYCODONE-ACETAMINOPHEN 5-325 MG PO TABS: 1.0000 | ORAL_TABLET | ORAL | 0 refills | Status: DC | PRN

## 2016-07-09 NOTE — Progress Notes (Signed)
I agree with the assessment by the previous RN. Jurnee Nakayama RN 

## 2016-07-09 NOTE — Care Management Note (Signed)
Case Management Note  Patient Details  Name: Keith Ortiz MRN: [id number] Date of Birth: 1950-04-08  Subjective/Objective: 66 y/o m admitted w/renal calculus. From home.                   Action/Plan:d/c home.   Expected Discharge Date:  07/09/16               Expected Discharge Plan:  Home/Self Care  In-House Referral:     Discharge planning Services  CM Consult  Post Acute Care Choice:    Choice offered to:     DME Arranged:    DME Agency:     HH Arranged:    HH Agency:     Status of Service:  Completed, signed off  If discussed at [employer] Length of Stay Meetings, dates discussed:    Additional Comments:  Keith Ortiz, Keith Cuartas, RN 07/09/2016, 10:52 AM

## 2016-07-09 NOTE — Discharge Instructions (Signed)

## 2016-07-11 ENCOUNTER — Other Ambulatory Visit: Payer: Self-pay | Admitting: Urology

## 2016-07-12 NOTE — Discharge Summary (Signed)
Physician Discharge Summary  Patient ID: Keith RainwaterJames S Wilms MRN: [id number] DOB/AGE: [birth year] 66 y.o.  Admit date: 07/08/2016 Discharge date: 07/09/2016  Admission Diagnoses: Left renal calculus Discharge Diagnoses:  Active Problems:   Renal calculus, left   Discharged Condition: good  Hospital Course: The patient tolerated the procedure well and was transferred to the floor on IV pain meds, IV fluid. On POD#1 foley was removed, pt was started on regular diet and they ambulated in the halls. Prior to discharge the pt was tolerating a regular diet, pain was controlled on PO pain meds, they were ambulating without difficulty, and they had normal bowel function.  Consults: None  Significant Diagnostic Studies: none  Treatments: surgery: L PCNL  Discharge Exam: Blood pressure 126/65, pulse 100, temperature 98.1 F (36.7 C), temperature source Oral, resp. rate 18, height 6' (1.829 m), weight 100.7 kg (222 lb), SpO2 96 %. General appearance: alert, cooperative and appears stated age Eyes: conjunctivae/corneas clear. PERRL, EOM's intact. Fundi benign. Neck: no adenopathy, no carotid bruit, no JVD, supple, symmetrical, trachea midline and thyroid not enlarged, symmetric, no tenderness/mass/nodules  Disposition: 01-Home or Self Care  Discharge Instructions    Discharge patient    Complete by:  As directed    Discharge disposition:  01-Home or Self Care   Discharge patient date:  07/09/2016     Allergies as of 07/09/2016   No Known Allergies     Medication List    TAKE these medications   aspirin EC 81 MG tablet Take 81 mg by mouth daily.   atorvastatin 40 MG tablet Commonly known as:  LIPITOR Take 40 mg by mouth daily.   ezetimibe 10 MG tablet Commonly known as:  ZETIA Take 10 mg by mouth daily.   metFORMIN 500 MG 24 hr tablet Commonly known as:  GLUCOPHAGE-XR Take 500 mg by mouth daily.   oxyCODONE-acetaminophen 5-325 MG tablet Commonly known as:   PERCOCET/ROXICET Take 1-2 tablets by mouth every 4 (four) hours as needed for moderate pain.   valsartan-hydrochlorothiazide 160-12.5 MG tablet Commonly known as:  DIOVAN-HCT Take 1 tablet by mouth daily.      Follow-up Information    McKenzie, Mardene CelestePatrick L, MD. Call on 07/11/2016.   Specialty:  Urology Contact information: [address] [place] [address] [phone]           Signed: Wilkie Ayeatrick McKenzie 07/12/2016, 12:42 PM

## 2016-07-18 ENCOUNTER — Ambulatory Visit (HOSPITAL_COMMUNITY): Payer: No Typology Code available for payment source | Admitting: Certified Registered Nurse Anesthetist

## 2016-07-18 ENCOUNTER — Ambulatory Visit (HOSPITAL_COMMUNITY)
Admission: RE | Admit: 2016-07-18 | Discharge: 2016-07-18 | Disposition: A | Discharge status: Home / Self Care | Payer: No Typology Code available for payment source | Source: Ambulatory Visit | Attending: Urology | Admitting: Urology

## 2016-07-18 ENCOUNTER — Encounter (HOSPITAL_COMMUNITY)
Admission: RE | Disposition: A | Discharge status: Home / Self Care | Payer: Self-pay | Source: Ambulatory Visit | Attending: Urology

## 2016-07-18 ENCOUNTER — Ambulatory Visit (HOSPITAL_COMMUNITY): Payer: No Typology Code available for payment source

## 2016-07-18 ENCOUNTER — Encounter (HOSPITAL_COMMUNITY): Payer: Self-pay | Admitting: *Deleted

## 2016-07-18 DIAGNOSIS — Z7982 Long term (current) use of aspirin: Secondary | ICD-10-CM | POA: Insufficient documentation

## 2016-07-18 DIAGNOSIS — N200 Calculus of kidney: Secondary | ICD-10-CM | POA: Insufficient documentation

## 2016-07-18 DIAGNOSIS — M1990 Unspecified osteoarthritis, unspecified site: Secondary | ICD-10-CM | POA: Insufficient documentation

## 2016-07-18 DIAGNOSIS — E119 Type 2 diabetes mellitus without complications: Secondary | ICD-10-CM | POA: Diagnosis not present

## 2016-07-18 DIAGNOSIS — E785 Hyperlipidemia, unspecified: Secondary | ICD-10-CM | POA: Insufficient documentation

## 2016-07-18 DIAGNOSIS — Z7984 Long term (current) use of oral hypoglycemic drugs: Secondary | ICD-10-CM | POA: Insufficient documentation

## 2016-07-18 DIAGNOSIS — Z87442 Personal history of urinary calculi: Secondary | ICD-10-CM | POA: Insufficient documentation

## 2016-07-18 DIAGNOSIS — I10 Essential (primary) hypertension: Secondary | ICD-10-CM | POA: Insufficient documentation

## 2016-07-18 HISTORY — PX: NEPHROLITHOTOMY: SHX5134

## 2016-07-18 LAB — CBC
HEMATOCRIT: 40.5 % (ref 39.0–52.0)
HEMOGLOBIN: 14.7 g/dL (ref 13.0–17.0)
MCH: 34.2 pg — ABNORMAL HIGH (ref 26.0–34.0)
MCHC: 36.3 g/dL — ABNORMAL HIGH (ref 30.0–36.0)
MCV: 94.2 fL (ref 78.0–100.0)
Platelets: 304 10*3/uL (ref 150–400)
RBC: 4.3 MIL/uL (ref 4.22–5.81)
RDW: 12.8 % (ref 11.5–15.5)
WBC: 9.4 10*3/uL (ref 4.0–10.5)

## 2016-07-18 LAB — GLUCOSE, CAPILLARY
GLUCOSE-CAPILLARY: 104 mg/dL — AB (ref 65–99)
Glucose-Capillary: 146 mg/dL — ABNORMAL HIGH (ref 65–99)

## 2016-07-18 SURGERY — NEPHROLITHOTOMY PERCUTANEOUS SECOND LOOK
Anesthesia: General | Laterality: Left

## 2016-07-18 MED ORDER — ROCURONIUM BROMIDE 50 MG/5ML IV SOSY
PREFILLED_SYRINGE | INTRAVENOUS | Status: DC | PRN
  Administered 2016-07-18: 50 mg via INTRAVENOUS

## 2016-07-18 MED ORDER — LACTATED RINGERS IV SOLN
INTRAVENOUS | Status: DC
  Administered 2016-07-18 (×2): via INTRAVENOUS

## 2016-07-18 MED ORDER — OXYCODONE-ACETAMINOPHEN 5-325 MG PO TABS: 1.0000 | ORAL_TABLET | ORAL | 0 refills | Status: DC | PRN

## 2016-07-18 MED ORDER — LIDOCAINE 2% (20 MG/ML) 5 ML SYRINGE
INTRAMUSCULAR | Status: AC
  Filled 2016-07-18: qty 5

## 2016-07-18 MED ORDER — FENTANYL CITRATE (PF) 100 MCG/2ML IJ SOLN
INTRAMUSCULAR | Status: DC | PRN
  Administered 2016-07-18 (×2): 50 ug via INTRAVENOUS

## 2016-07-18 MED ORDER — ROCURONIUM BROMIDE 50 MG/5ML IV SOSY
PREFILLED_SYRINGE | INTRAVENOUS | Status: AC
  Filled 2016-07-18: qty 5

## 2016-07-18 MED ORDER — FENTANYL CITRATE (PF) 100 MCG/2ML IJ SOLN: 25.0000 ug | INTRAMUSCULAR | Status: DC | PRN

## 2016-07-18 MED ORDER — ONDANSETRON HCL 4 MG/2ML IJ SOLN: 4.0000 mg | Freq: Once | INTRAMUSCULAR | Status: DC | PRN

## 2016-07-18 MED ORDER — ONDANSETRON HCL 4 MG/2ML IJ SOLN
INTRAMUSCULAR | Status: AC
  Filled 2016-07-18: qty 2

## 2016-07-18 MED ORDER — MIDAZOLAM HCL 5 MG/5ML IJ SOLN
INTRAMUSCULAR | Status: DC | PRN
  Administered 2016-07-18: 2 mg via INTRAVENOUS

## 2016-07-18 MED ORDER — CEFAZOLIN SODIUM-DEXTROSE 2-4 GM/100ML-% IV SOLN
2.0000 g | INTRAVENOUS | Status: AC
  Administered 2016-07-18: 2 g via INTRAVENOUS
  Filled 2016-07-18: qty 100

## 2016-07-18 MED ORDER — MIDAZOLAM HCL 2 MG/2ML IJ SOLN
INTRAMUSCULAR | Status: AC
  Filled 2016-07-18: qty 2

## 2016-07-18 MED ORDER — OXYCODONE-ACETAMINOPHEN 5-325 MG PO TABS: 1.0000 | ORAL_TABLET | ORAL | Status: DC | PRN

## 2016-07-18 MED ORDER — MEPERIDINE HCL 50 MG/ML IJ SOLN: 6.2500 mg | INTRAMUSCULAR | Status: DC | PRN

## 2016-07-18 MED ORDER — FENTANYL CITRATE (PF) 100 MCG/2ML IJ SOLN
INTRAMUSCULAR | Status: AC
  Filled 2016-07-18: qty 2

## 2016-07-18 MED ORDER — SUGAMMADEX SODIUM 200 MG/2ML IV SOLN
INTRAVENOUS | Status: DC | PRN
  Administered 2016-07-18: 200 mg via INTRAVENOUS

## 2016-07-18 MED ORDER — PHENYLEPHRINE 40 MCG/ML (10ML) SYRINGE FOR IV PUSH (FOR BLOOD PRESSURE SUPPORT)
PREFILLED_SYRINGE | INTRAVENOUS | Status: DC | PRN
  Administered 2016-07-18 (×2): 80 ug via INTRAVENOUS
  Administered 2016-07-18: 120 ug via INTRAVENOUS
  Administered 2016-07-18: 80 ug via INTRAVENOUS
  Administered 2016-07-18: 120 ug via INTRAVENOUS
  Administered 2016-07-18 (×3): 80 ug via INTRAVENOUS

## 2016-07-18 MED ORDER — MEPERIDINE HCL 50 MG/ML IJ SOLN
6.2500 mg | INTRAMUSCULAR | Status: DC | PRN
Start: 2016-07-18 — End: 2016-07-18

## 2016-07-18 MED ORDER — SODIUM CHLORIDE 0.9 % IR SOLN
Status: DC | PRN
  Administered 2016-07-18: 3000 mL
  Administered 2016-07-18: 1000 mL

## 2016-07-18 MED ORDER — PHENYLEPHRINE 40 MCG/ML (10ML) SYRINGE FOR IV PUSH (FOR BLOOD PRESSURE SUPPORT)
PREFILLED_SYRINGE | INTRAVENOUS | Status: AC
  Filled 2016-07-18: qty 20

## 2016-07-18 MED ORDER — ONDANSETRON HCL 4 MG/2ML IJ SOLN
INTRAMUSCULAR | Status: DC | PRN
  Administered 2016-07-18: 4 mg via INTRAVENOUS

## 2016-07-18 MED ORDER — IOHEXOL 300 MG/ML  SOLN
INTRAMUSCULAR | Status: DC | PRN
  Administered 2016-07-18: 7 mL

## 2016-07-18 MED ORDER — PROPOFOL 10 MG/ML IV BOLUS
INTRAVENOUS | Status: AC
  Filled 2016-07-18: qty 20

## 2016-07-18 MED ORDER — LIDOCAINE 2% (20 MG/ML) 5 ML SYRINGE
INTRAMUSCULAR | Status: DC | PRN
  Administered 2016-07-18: 100 mg via INTRAVENOUS

## 2016-07-18 MED ORDER — PROPOFOL 10 MG/ML IV BOLUS
INTRAVENOUS | Status: DC | PRN
  Administered 2016-07-18: 200 mg via INTRAVENOUS

## 2016-07-18 MED ORDER — PROPOFOL 10 MG/ML IV BOLUS
INTRAVENOUS | Status: AC
Start: 2016-07-18 — End: 2016-07-18
  Filled 2016-07-18: qty 20

## 2016-07-18 SURGICAL SUPPLY — 65 items
BAG URINE DRAINAGE (UROLOGICAL SUPPLIES) IMPLANT
BASKET STONE NITINOL 3FRX115MB (UROLOGICAL SUPPLIES) ×3 IMPLANT
BASKET ZERO TIP NITINOL 2.4FR (BASKET) IMPLANT
BENZOIN TINCTURE PRP APPL 2/3 (GAUZE/BANDAGES/DRESSINGS) ×3 IMPLANT
BLADE SURG 15 STRL LF DISP TIS (BLADE) IMPLANT
BLADE SURG 15 STRL SS (BLADE)
CATH FOLEY 2W COUNCIL 20FR 5CC (CATHETERS) IMPLANT
CATH FOLEY 2WAY SLVR  5CC 16FR (CATHETERS)
CATH FOLEY 2WAY SLVR  5CC 18FR (CATHETERS)
CATH FOLEY 2WAY SLVR 5CC 16FR (CATHETERS) IMPLANT
CATH FOLEY 2WAY SLVR 5CC 18FR (CATHETERS) IMPLANT
CATH IMAGER II 65CM (CATHETERS) IMPLANT
CATH INTERMIT  6FR 70CM (CATHETERS) IMPLANT
CATH ROBINSON RED A/P 20FR (CATHETERS) IMPLANT
CATH URET DUAL LUMEN 6-10FR 50 (CATHETERS) IMPLANT
CATH X-FORCE N30 NEPHROSTOMY (TUBING) IMPLANT
COVER SURGICAL LIGHT HANDLE (MISCELLANEOUS) IMPLANT
DRAPE C-ARM 42X120 X-RAY (DRAPES) ×3 IMPLANT
DRAPE LINGEMAN PERC (DRAPES) ×3 IMPLANT
DRAPE SHEET LG 3/4 BI-LAMINATE (DRAPES) IMPLANT
DRAPE SURG IRRIG POUCH 19X23 (DRAPES) ×3 IMPLANT
DRSG PAD ABDOMINAL 8X10 ST (GAUZE/BANDAGES/DRESSINGS) IMPLANT
DRSG TEGADERM 8X12 (GAUZE/BANDAGES/DRESSINGS) ×3 IMPLANT
EXTRACTOR STONE NITINOL NGAGE (UROLOGICAL SUPPLIES) ×3 IMPLANT
FIBER LASER FLEXIVA 1000 (UROLOGICAL SUPPLIES) IMPLANT
FIBER LASER FLEXIVA 365 (UROLOGICAL SUPPLIES) IMPLANT
FIBER LASER FLEXIVA 550 (UROLOGICAL SUPPLIES) IMPLANT
FIBER LASER TRAC TIP (UROLOGICAL SUPPLIES) IMPLANT
GAUZE SPONGE 4X4 12PLY STRL (GAUZE/BANDAGES/DRESSINGS) ×3 IMPLANT
GLOVE BIO SURGEON STRL SZ8 (GLOVE) ×3 IMPLANT
GLOVE BIOGEL PI IND STRL 8 (GLOVE) IMPLANT
GLOVE BIOGEL PI INDICATOR 8 (GLOVE)
GOWN STRL REUS W/TWL LRG LVL3 (GOWN DISPOSABLE) ×6 IMPLANT
GOWN STRL REUS W/TWL XL LVL3 (GOWN DISPOSABLE) IMPLANT
GUIDEWIRE AMPLAZ .035X145 (WIRE) ×3 IMPLANT
GUIDEWIRE STR DUAL SENSOR (WIRE) ×3 IMPLANT
HOLDER NEEDLE AMPLATZ W/INSERT (MISCELLANEOUS) IMPLANT
IV SET EXTENSION CATH 6 NF (IV SETS) IMPLANT
KIT BASIN OR (CUSTOM PROCEDURE TRAY) ×3 IMPLANT
MANIFOLD NEPTUNE II (INSTRUMENTS) ×3 IMPLANT
NEEDLE TROCAR 18X15 ECHO (NEEDLE) IMPLANT
NEEDLE TROCAR 18X20 (NEEDLE) IMPLANT
NS IRRIG 1000ML POUR BTL (IV SOLUTION) IMPLANT
PACK CYSTO (CUSTOM PROCEDURE TRAY) ×3 IMPLANT
PAD ABD 8X10 STRL (GAUZE/BANDAGES/DRESSINGS) ×3 IMPLANT
PROBE LITHOCLAST ULTRA 3.8X403 (UROLOGICAL SUPPLIES) IMPLANT
PROBE PNEUMATIC 1.0MMX570MM (UROLOGICAL SUPPLIES) IMPLANT
SET AMPLATZ RENAL DILATOR (MISCELLANEOUS) IMPLANT
SET IRRIG Y TYPE TUR BLADDER L (SET/KITS/TRAYS/PACK) IMPLANT
SHEATH PEELAWAY SET 9 (SHEATH) IMPLANT
SPONGE LAP 4X18 X RAY DECT (DISPOSABLE) ×3 IMPLANT
STENT CONTOUR 6FRX26X.038 (STENTS) IMPLANT
STONE CATCHER W/TUBE ADAPTER (UROLOGICAL SUPPLIES) IMPLANT
SUT SILK 2 0 30  PSL (SUTURE)
SUT SILK 2 0 30 PSL (SUTURE) IMPLANT
SUT VIC AB 3-0 SH 27 (SUTURE) ×2
SUT VIC AB 3-0 SH 27X BRD (SUTURE) ×1 IMPLANT
SYR 10ML LL (SYRINGE) ×3 IMPLANT
SYR 20CC LL (SYRINGE) ×6 IMPLANT
SYR 50ML LL SCALE MARK (SYRINGE) IMPLANT
TAPE CLOTH SURG 4X10 WHT LF (GAUZE/BANDAGES/DRESSINGS) ×3 IMPLANT
TOWEL OR 17X26 10 PK STRL BLUE (TOWEL DISPOSABLE) ×3 IMPLANT
TUBING CONNECTING 10 (TUBING) ×2 IMPLANT
TUBING CONNECTING 10' (TUBING) ×1
WATER STERILE IRR 1500ML POUR (IV SOLUTION) IMPLANT

## 2016-07-18 NOTE — Transfer of Care (Signed)
Immediate Anesthesia Transfer of Care Note  Patient: Keith RainwaterJames S Brabender  Procedure(s) Performed: Procedure(s): NEPHROLITHOTOMY PERCUTANEOUS SECOND LOOK (Left)  Patient Location: PACU  Anesthesia Type:General  Level of Consciousness:  sedated, patient cooperative and responds to stimulation  Airway & Oxygen Therapy:Patient Spontanous Breathing and Patient connected to face mask oxgen  Post-op Assessment:  Report given to PACU RN and Post -op Vital signs reviewed and stable  Post vital signs:  Reviewed and stable  Last Vitals:  Vitals:   07/18/16 1104  BP: 135/86  Pulse: (!) 101  Resp: 20  Temp: 37.1 C    Complications: No apparent anesthesia complications

## 2016-07-18 NOTE — Op Note (Signed)
Preoperative diagnosis: Left renal stone  Postoperative diagnosis: Same  Procedure 1.  Left second look percutaneous nephrostolithotomy for stone greater than 2 cm 2.  Left nephrostogram 3.  Intraoperative fluoroscopy, under 1 hour, with interpretation  Attending: Dr. Ronne BinningMckenzie  Anesthesia: General  Estimated blood loss: Minimal  Antibiotics: ancef  Drains: 1.  6 x 26 left double-J ureteral stent   Specimens: Stone for analysis  Findings: 1.5cm left upper pole and multiple 3-907mm lower pole calculi  Indications: Patient is a 66 year old male with a history of large left renal stones who is status post first look PCNL. He had multiple residual fragments.  After discussing treatment options and decided he was left percutaneous nephrostolithotomy.  Patient already has a nephrostomy tube.  Procedure in detail: Prior to procedure consent was obtained.  Patient was brought to the operating room debridement was done to ensure correct patient, correct procedure, and correct site.  General anesthesia was administered.  A 16 French Foley catheter was in place.  The patient was then placed in the prone position.  His nephrostomy tube and left flank was then prepped and draped in usual sterile fashion.  A nephrostogram was obtained and findings noted above.  We removed the nephrostomy tube and then used the flexible nephroscope to remove the residual fragments.  [facility] stone fragments and sent for composition analysis.  Once the majority of the stone was removed we then were able to perform ureteroscopy.  We advanced ureteroscope down to the level of bladder.  The nephrostomy tube site was closed with a 3-0 interrupted vicryl.  Dressing was placed over the nephrostomy tube site and this then concluded the procedure was well-tolerated by the patient.  Complications: None  Condition: Stable, extubated, transferred to PACU  Plan: Patient is to be discharged home and followup  in 1 week for stent removal

## 2016-07-18 NOTE — H&P (View-Only) (Signed)
Urology Admission H&P  Chief Complaint: left flank pain  History of Present Illness: Mr Keith Ortiz is a 66yo with left staghorn calculus.  Past Medical History:  Diagnosis Date  . Arthritis   . Diabetes mellitus without complication (HCC)    type 2  . History of kidney stones   . HLD (hyperlipidemia)   . Hypertension   . Pneumonia    Past Surgical History:  Procedure Laterality Date  . BLADDER STONE REMOVAL    . FRACTURE SURGERY    . HERNIA REPAIR    . LITHOTRIPSY    . TONSILLECTOMY      Home Medications:  Prescriptions Prior to Admission  Medication Sig Dispense Refill Last Dose  . atorvastatin (LIPITOR) 40 MG tablet Take 40 mg by mouth daily.  0 07/08/2016 at 0700  . ezetimibe (ZETIA) 10 MG tablet Take 10 mg by mouth daily.  0 07/08/2016 at 0700  . metFORMIN (GLUCOPHAGE-XR) 500 MG 24 hr tablet Take 500 mg by mouth daily.  0 07/07/2016 at Unknown time  . valsartan-hydrochlorothiazide (DIOVAN-HCT) 160-12.5 MG tablet Take 1 tablet by mouth daily.  0 07/07/2016 at Unknown time  . aspirin EC 81 MG tablet Take 81 mg by mouth daily.   about 2 weeks ago   Allergies: No Known Allergies  History reviewed. No pertinent family history. Social History:  reports that he has never smoked. He has never used smokeless tobacco. He reports that he drinks alcohol. He reports that he does not use drugs.  Review of Systems  Genitourinary: Positive for flank pain.  All other systems reviewed and are negative.   Physical Exam:  Vital signs in last 24 hours: Temp:  [98.2 F (36.8 C)] 98.2 F (36.8 C) (06/25 0854) Pulse Rate:  [86] 86 (06/25 0854) Resp:  [18] 18 (06/25 0854) BP: (133)/(85) 133/85 (06/25 0854) SpO2:  [97 %] 97 % (06/25 0854) Weight:  [100.7 kg (222 lb)] 100.7 kg (222 lb) (06/25 0908) Physical Exam  Constitutional: He is oriented to person, place, and time. He appears well-developed and well-nourished.  HENT:  Head: Normocephalic and atraumatic.  Eyes: EOM are normal. Pupils  are equal, round, and reactive to light.  Neck: Normal range of motion. No thyromegaly present.  Cardiovascular: Normal rate and regular rhythm.   Respiratory: Effort normal. No respiratory distress.  GI: Soft. He exhibits no distension.  Musculoskeletal: Normal range of motion. He exhibits no edema.  Neurological: He is alert and oriented to person, place, and time.  Skin: Skin is warm and dry.  Psychiatric: He has a normal mood and affect. His behavior is normal. Judgment and thought content normal.    Laboratory Data:  Results for orders placed or performed during the hospital encounter of 07/08/16 (from the past 24 hour(s))  Glucose, capillary     Status: Abnormal   Collection Time: 07/08/16  8:55 AM  Result Value Ref Range   Glucose-Capillary 157 (H) 65 - 99 mg/dL   No results found for this or any previous visit (from the past 240 hour(s)). Creatinine:  Recent Labs  07/03/16 1110  CREATININE 1.14   Baseline Creatinine: 1.1  Impression/Assessment:  66yo with left staghorn calculus  Plan:  The risks/benefits/alternatives to L PCNL was explained to the patient and he understands and wishes to proceed with surgery  Wilkie AyePatrick Calayah Guadarrama 07/08/2016, 10:56 AM

## 2016-07-18 NOTE — Discharge Instructions (Signed)
General Anesthesia, Adult, Care After °These instructions provide you with information about caring for yourself after your procedure. Your health care provider may also give you more specific instructions. Your treatment has been planned according to current medical practices, but problems sometimes occur. Call your health care provider if you have any problems or questions after your procedure. °What can I expect after the procedure? °After the procedure, it is common to have: °· Vomiting. °· A sore throat. °· Mental slowness. ° °It is common to feel: °· Nauseous. °· Cold or shivery. °· Sleepy. °· Tired. °· Sore or achy, even in parts of your body where you did not have surgery. ° °Follow these instructions at home: °For at least 24 hours after the procedure: °· Do not: °? Participate in activities where you could fall or become injured. °? Drive. °? Use heavy machinery. °? Drink alcohol. °? Take sleeping pills or medicines that cause drowsiness. °? Make important decisions or sign legal documents. °? Take care of children on your own. °· Rest. °Eating and drinking °· If you vomit, drink water, juice, or soup when you can drink without vomiting. °· Drink enough fluid to keep your urine clear or pale yellow. °· Make sure you have little or no nausea before eating solid foods. °· Follow the diet recommended by your health care provider. °General instructions °· Have a responsible adult stay with you until you are awake and alert. °· Return to your normal activities as told by your health care provider. Ask your health care provider what activities are safe for you. °· Take over-the-counter and prescription medicines only as told by your health care provider. °· If you smoke, do not smoke without supervision. °· Keep all follow-up visits as told by your health care provider. This is important. °Contact a health care provider if: °· You continue to have nausea or vomiting at home, and medicines are not helpful. °· You  cannot drink fluids or start eating again. °· You cannot urinate after 8-12 hours. °· You develop a skin rash. °· You have fever. °· You have increasing redness at the site of your procedure. °Get help right away if: °· You have difficulty breathing. °· You have chest pain. °· You have unexpected bleeding. °· You feel that you are having a life-threatening or urgent problem. °This information is not intended to replace advice given to you by your health care provider. Make sure you discuss any questions you have with your health care provider. °Document Released: 04/08/2000 Document Revised: 06/05/2015 Document Reviewed: 12/15/2014 °Elsevier Interactive Patient Education © 2018 [employer] °Ureteral Stent Implantation, Care After °Refer to this sheet in the next few weeks. These instructions provide you with information about caring for yourself after your procedure. Your health care provider may also give you more specific instructions. Your treatment has been planned according to current medical practices, but problems sometimes occur. Call your health care provider if you have any problems or questions after your procedure. °What can I expect after the procedure? °After the procedure, it is common to have: °· Nausea. °· Mild pain when you urinate. You may feel this pain in your lower back or lower abdomen. Pain should stop within a few minutes after you urinate. This may last for up to 1 week. °· A small amount of blood in your urine for several days. ° °Follow these instructions at home: ° °Medicines °· Take over-the-counter and prescription medicines only as told by your health care provider. °·   If you were prescribed an antibiotic medicine, take it as told by your health care provider. Do not stop taking the antibiotic even if you start to feel better. °· Do not drive for 24 hours if you received a sedative. °· Do not drive or operate heavy machinery while taking prescription pain  medicines. °Activity °· Return to your normal activities as told by your health care provider. Ask your health care provider what activities are safe for you. °· Do not lift anything that is heavier than 10 lb (4.5 kg). Follow this limit for 1 week after your procedure, or for as long as told by your health care provider. °General instructions °· Watch for any blood in your urine. Call your health care provider if the amount of blood in your urine increases. °· If you have a catheter: °? Follow instructions from your health care provider about taking care of your catheter and collection bag. °? Do not take baths, swim, or use a hot tub until your health care provider approves. °· Drink enough fluid to keep your urine clear or pale yellow. °· Keep all follow-up visits as told by your health care provider. This is important. °Contact a health care provider if: °· You have pain that gets worse or does not get better with medicine, especially pain when you urinate. °· You have difficulty urinating. °· You feel nauseous or you vomit repeatedly during a period of more than 2 days after the procedure. °Get help right away if: °· Your urine is dark red or has blood clots in it. °· You are leaking urine (have incontinence). °· The end of the stent comes out of your urethra. °· You cannot urinate. °· You have sudden, sharp, or severe pain in your abdomen or lower back. °· You have a fever. °This information is not intended to replace advice given to you by your health care provider. Make sure you discuss any questions you have with your health care provider. °Document Released: 09/02/2012 Document Revised: 06/08/2015 Document Reviewed: 07/15/2014 °Elsevier Interactive Patient Education © 2018 [employer] ° °

## 2016-07-18 NOTE — Interval H&P Note (Signed)
History and Physical Interval Note:  07/18/2016 1:01 PM  Keith RainwaterJames S Ortiz  has presented today for surgery, with the diagnosis of LEFT RENAL CALCULI  The various methods of treatment have been discussed with the patient and family. After consideration of risks, benefits and other options for treatment, the patient has consented to  Procedure(s): NEPHROLITHOTOMY PERCUTANEOUS SECOND LOOK (Left) HOLMIUM LASER APPLICATION (Left) as a surgical intervention .  The patient's history has been reviewed, patient examined, no change in status, stable for surgery.  I have reviewed the patient's chart and labs.  Questions were answered to the patient's satisfaction.     Wilkie AyePatrick McKenzie

## 2016-07-18 NOTE — Anesthesia Postprocedure Evaluation (Signed)
Anesthesia Post Note  Patient: Keith Ortiz  Procedure(s) Performed: Procedure(s) (LRB): NEPHROLITHOTOMY PERCUTANEOUS SECOND LOOK (Left)     Anesthesia Post Evaluation  Last Vitals:  Vitals:   07/18/16 1439 07/18/16 1445  BP: (!) 148/81 128/74  Pulse: 89 74  Resp: 15 14  Temp: 36.8 C     Last Pain:  Vitals:   07/18/16 1500  TempSrc:   PainSc: 0-No pain                 Kanisha Duba

## 2016-07-18 NOTE — OR Nursing (Signed)
Stones taken per Er. McKenzie.

## 2016-07-18 NOTE — Anesthesia Procedure Notes (Signed)
Procedure Name: Intubation Date/Time: 07/18/2016 1:25 PM Performed by: Montel Clock Pre-anesthesia Checklist: Patient identified, Emergency Drugs available, Suction available and Patient being monitored Patient Re-evaluated:Patient Re-evaluated prior to inductionOxygen Delivery Method: Circle system utilized Preoxygenation: Pre-oxygenation with 100% oxygen Intubation Type: IV induction Ventilation: Mask ventilation without difficulty and Oral airway inserted - appropriate to patient size Laryngoscope Size: Mac and 3 Grade View: Grade II Tube type: Oral Tube size: 7.5 mm Number of attempts: 1 Airway Equipment and Method: Stylet and Oral airway Placement Confirmation: ETT inserted through vocal cords under direct vision,  positive ETCO2 and breath sounds checked- equal and bilateral Secured at: 23 cm Tube secured with: Tape Dental Injury: Teeth and Oropharynx as per pre-operative assessment

## 2016-07-18 NOTE — Anesthesia Preprocedure Evaluation (Signed)
Anesthesia Evaluation  Patient identified by MRN, date of birth, ID band Patient awake    Reviewed: Allergy & Precautions, NPO status , Patient's Chart, lab work & pertinent test results  Airway Mallampati: II  TM Distance: >3 FB Neck ROM: Full    Dental no notable dental hx.    Pulmonary neg pulmonary ROS,    Pulmonary exam normal breath sounds clear to auscultation       Cardiovascular hypertension, Pt. on medications negative cardio ROS Normal cardiovascular exam Rhythm:Regular Rate:Normal     Neuro/Psych negative neurological ROS  negative psych ROS   GI/Hepatic negative GI ROS, Neg liver ROS,   Endo/Other  negative endocrine ROSdiabetes, Type 2, Oral Hypoglycemic Agents  Renal/GU negative Renal ROS  negative genitourinary   Musculoskeletal negative musculoskeletal ROS (+) Arthritis , Osteoarthritis,    Abdominal (+) + obese,   Peds negative pediatric ROS (+)  Hematology negative hematology ROS (+)   Anesthesia Other Findings   Reproductive/Obstetrics negative OB ROS                            Anesthesia Physical  Anesthesia Plan  ASA: II  Anesthesia Plan: General   Post-op Pain Management:    Induction: Intravenous  PONV Risk Score and Plan: 4 or greater and Ondansetron, Dexamethasone, Propofol, Midazolam and Treatment may vary due to age or medical condition  Airway Management Planned: LMA and Oral ETT  Additional Equipment:   Intra-op Plan:   Post-operative Plan: Extubation in OR  Informed Consent: I have reviewed the patients History and Physical, chart, labs and discussed the procedure including the risks, benefits and alternatives for the proposed anesthesia with the patient or authorized representative who has indicated his/her understanding and acceptance.   Dental advisory given  Plan Discussed with: CRNA  Anesthesia Plan Comments:         Anesthesia  Quick Evaluation

## 2016-07-22 ENCOUNTER — Emergency Department (HOSPITAL_COMMUNITY): Payer: No Typology Code available for payment source

## 2016-07-22 ENCOUNTER — Inpatient Hospital Stay (HOSPITAL_COMMUNITY)
Admission: EM | Admit: 2016-07-22 | Discharge: 2016-07-25 | DRG: 871 | Disposition: A | Discharge status: Home / Self Care | Payer: No Typology Code available for payment source | Attending: Internal Medicine | Admitting: Internal Medicine

## 2016-07-22 ENCOUNTER — Encounter (HOSPITAL_COMMUNITY): Payer: Self-pay | Admitting: Emergency Medicine

## 2016-07-22 DIAGNOSIS — N390 Urinary tract infection, site not specified: Secondary | ICD-10-CM

## 2016-07-22 DIAGNOSIS — N200 Calculus of kidney: Secondary | ICD-10-CM | POA: Diagnosis present

## 2016-07-22 DIAGNOSIS — E1169 Type 2 diabetes mellitus with other specified complication: Secondary | ICD-10-CM | POA: Diagnosis not present

## 2016-07-22 DIAGNOSIS — E872 Acidosis: Secondary | ICD-10-CM | POA: Diagnosis present

## 2016-07-22 DIAGNOSIS — N10 Acute tubulo-interstitial nephritis: Secondary | ICD-10-CM | POA: Diagnosis not present

## 2016-07-22 DIAGNOSIS — R310 Gross hematuria: Secondary | ICD-10-CM | POA: Diagnosis present

## 2016-07-22 DIAGNOSIS — E785 Hyperlipidemia, unspecified: Secondary | ICD-10-CM | POA: Diagnosis present

## 2016-07-22 DIAGNOSIS — Z7984 Long term (current) use of oral hypoglycemic drugs: Secondary | ICD-10-CM | POA: Diagnosis not present

## 2016-07-22 DIAGNOSIS — A4181 Sepsis due to Enterococcus: Principal | ICD-10-CM | POA: Diagnosis present

## 2016-07-22 DIAGNOSIS — Z7982 Long term (current) use of aspirin: Secondary | ICD-10-CM | POA: Diagnosis not present

## 2016-07-22 DIAGNOSIS — Z79899 Other long term (current) drug therapy: Secondary | ICD-10-CM | POA: Diagnosis not present

## 2016-07-22 DIAGNOSIS — R6521 Severe sepsis with septic shock: Secondary | ICD-10-CM | POA: Diagnosis present

## 2016-07-22 DIAGNOSIS — E119 Type 2 diabetes mellitus without complications: Secondary | ICD-10-CM | POA: Diagnosis present

## 2016-07-22 DIAGNOSIS — A419 Sepsis, unspecified organism: Secondary | ICD-10-CM | POA: Diagnosis present

## 2016-07-22 DIAGNOSIS — R000 Tachycardia, unspecified: Secondary | ICD-10-CM | POA: Diagnosis present

## 2016-07-22 DIAGNOSIS — N179 Acute kidney failure, unspecified: Secondary | ICD-10-CM | POA: Diagnosis present

## 2016-07-22 DIAGNOSIS — I10 Essential (primary) hypertension: Secondary | ICD-10-CM | POA: Diagnosis present

## 2016-07-22 DIAGNOSIS — R6520 Severe sepsis without septic shock: Secondary | ICD-10-CM | POA: Diagnosis not present

## 2016-07-22 DIAGNOSIS — I959 Hypotension, unspecified: Secondary | ICD-10-CM | POA: Diagnosis present

## 2016-07-22 DIAGNOSIS — E875 Hyperkalemia: Secondary | ICD-10-CM | POA: Diagnosis present

## 2016-07-22 LAB — URINALYSIS, ROUTINE W REFLEX MICROSCOPIC: SQUAMOUS EPITHELIAL / LPF: NONE SEEN

## 2016-07-22 LAB — CBC WITH DIFFERENTIAL/PLATELET
BASOS ABS: 0 10*3/uL (ref 0.0–0.1)
Basophils Relative: 0 %
EOS PCT: 0 %
Eosinophils Absolute: 0 10*3/uL (ref 0.0–0.7)
HEMATOCRIT: 37.2 % — AB (ref 39.0–52.0)
HEMOGLOBIN: 13.1 g/dL (ref 13.0–17.0)
LYMPHS ABS: 0.7 10*3/uL (ref 0.7–4.0)
LYMPHS PCT: 2 %
MCH: 34.1 pg — ABNORMAL HIGH (ref 26.0–34.0)
MCHC: 35.2 g/dL (ref 30.0–36.0)
MCV: 96.9 fL (ref 78.0–100.0)
MONOS PCT: 3 %
Monocytes Absolute: 1.1 10*3/uL — ABNORMAL HIGH (ref 0.1–1.0)
NEUTROS PCT: 95 %
Neutro Abs: 33.7 10*3/uL — ABNORMAL HIGH (ref 1.7–7.7)
Platelets: 211 10*3/uL (ref 150–400)
RBC: 3.84 MIL/uL — AB (ref 4.22–5.81)
RDW: 13.3 % (ref 11.5–15.5)
WBC MORPHOLOGY: INCREASED
WBC: 35.5 10*3/uL — AB (ref 4.0–10.5)

## 2016-07-22 LAB — LACTIC ACID, PLASMA
LACTIC ACID, VENOUS: 3.6 mmol/L — AB (ref 0.5–1.9)
Lactic Acid, Venous: 2.1 mmol/L (ref 0.5–1.9)

## 2016-07-22 LAB — COMPREHENSIVE METABOLIC PANEL
ALT: 33 U/L (ref 17–63)
ANION GAP: 14 (ref 5–15)
AST: 26 U/L (ref 15–41)
Albumin: 3.4 g/dL — ABNORMAL LOW (ref 3.5–5.0)
Alkaline Phosphatase: 59 U/L (ref 38–126)
BUN: 34 mg/dL — AB (ref 6–20)
CHLORIDE: 99 mmol/L — AB (ref 101–111)
CO2: 23 mmol/L (ref 22–32)
Calcium: 9.4 mg/dL (ref 8.9–10.3)
Creatinine, Ser: 2.43 mg/dL — ABNORMAL HIGH (ref 0.61–1.24)
GFR calc Af Amer: 31 mL/min — ABNORMAL LOW (ref >60)
GFR, EST NON AFRICAN AMERICAN: 27 mL/min — AB (ref >60)
Glucose, Bld: 261 mg/dL — ABNORMAL HIGH (ref 65–99)
POTASSIUM: 5.1 mmol/L (ref 3.5–5.1)
Sodium: 136 mmol/L (ref 135–145)
Total Bilirubin: 0.9 mg/dL (ref 0.3–1.2)
Total Protein: 6.7 g/dL (ref 6.5–8.1)

## 2016-07-22 LAB — CREATININE, SERUM
Creatinine, Ser: 1.94 mg/dL — ABNORMAL HIGH (ref 0.61–1.24)
GFR calc Af Amer: 41 mL/min — ABNORMAL LOW (ref >60)
GFR calc non Af Amer: 35 mL/min — ABNORMAL LOW (ref >60)

## 2016-07-22 LAB — GLUCOSE, CAPILLARY
GLUCOSE-CAPILLARY: 159 mg/dL — AB (ref 65–99)
Glucose-Capillary: 124 mg/dL — ABNORMAL HIGH (ref 65–99)

## 2016-07-22 LAB — CBC
HCT: 35.4 % — ABNORMAL LOW (ref 39.0–52.0)
Hemoglobin: 12.1 g/dL — ABNORMAL LOW (ref 13.0–17.0)
MCH: 33.4 pg (ref 26.0–34.0)
MCHC: 34.2 g/dL (ref 30.0–36.0)
MCV: 97.8 fL (ref 78.0–100.0)
PLATELETS: 176 10*3/uL (ref 150–400)
RBC: 3.62 MIL/uL — ABNORMAL LOW (ref 4.22–5.81)
RDW: 13.5 % (ref 11.5–15.5)
WBC: 32.2 10*3/uL — ABNORMAL HIGH (ref 4.0–10.5)

## 2016-07-22 LAB — MRSA PCR SCREENING: MRSA by PCR: NEGATIVE

## 2016-07-22 LAB — I-STAT CG4 LACTIC ACID, ED
LACTIC ACID, VENOUS: 3.64 mmol/L — AB (ref 0.5–1.9)
LACTIC ACID, VENOUS: 3.17 mmol/L — AB (ref 0.5–1.9)

## 2016-07-22 MED ORDER — ONDANSETRON HCL 4 MG/2ML IJ SOLN
4.0000 mg | Freq: Four times a day (QID) | INTRAMUSCULAR | Status: DC | PRN
  Administered 2016-07-22: 4 mg via INTRAVENOUS
  Filled 2016-07-22: qty 2

## 2016-07-22 MED ORDER — SODIUM CHLORIDE 0.9 % IV BOLUS (SEPSIS)
1000.0000 mL | Freq: Once | INTRAVENOUS | Status: AC
  Administered 2016-07-22: 1000 mL via INTRAVENOUS

## 2016-07-22 MED ORDER — ACETAMINOPHEN 325 MG PO TABS
650.0000 mg | ORAL_TABLET | ORAL | Status: DC | PRN
  Administered 2016-07-23 (×2): 650 mg via ORAL
  Filled 2016-07-22 (×2): qty 2

## 2016-07-22 MED ORDER — DEXTROSE 5 % IV SOLN
2.0000 g | Freq: Once | INTRAVENOUS | Status: AC
  Administered 2016-07-22: 2 g via INTRAVENOUS
  Filled 2016-07-22: qty 2

## 2016-07-22 MED ORDER — DEXTROSE 5 % IV SOLN: 1.0000 g | INTRAVENOUS | Status: DC

## 2016-07-22 MED ORDER — ACETAMINOPHEN 500 MG PO TABS
1000.0000 mg | ORAL_TABLET | Freq: Once | ORAL | Status: AC
  Administered 2016-07-22: 1000 mg via ORAL
  Filled 2016-07-22: qty 2

## 2016-07-22 MED ORDER — ONDANSETRON HCL 4 MG/2ML IJ SOLN
4.0000 mg | Freq: Once | INTRAMUSCULAR | Status: AC
  Administered 2016-07-22: 4 mg via INTRAVENOUS
  Filled 2016-07-22: qty 2

## 2016-07-22 MED ORDER — INSULIN ASPART 100 UNIT/ML ~~LOC~~ SOLN: 0.0000 [IU] | Freq: Every day | SUBCUTANEOUS | Status: DC

## 2016-07-22 MED ORDER — NOREPINEPHRINE BITARTRATE 1 MG/ML IV SOLN
0.0000 ug/min | Freq: Once | INTRAVENOUS | Status: DC
  Filled 2016-07-22: qty 4

## 2016-07-22 MED ORDER — SODIUM CHLORIDE 0.9 % IV SOLN: INTRAVENOUS | Status: DC

## 2016-07-22 MED ORDER — PHENAZOPYRIDINE HCL 200 MG PO TABS
200.0000 mg | ORAL_TABLET | Freq: Three times a day (TID) | ORAL | Status: DC
  Administered 2016-07-22 – 2016-07-25 (×8): 200 mg via ORAL
  Filled 2016-07-22 (×11): qty 1

## 2016-07-22 MED ORDER — DEXTROSE 5 % IV SOLN
1.0000 g | INTRAVENOUS | Status: DC
  Filled 2016-07-22: qty 1

## 2016-07-22 MED ORDER — ASPIRIN EC 81 MG PO TBEC
81.0000 mg | DELAYED_RELEASE_TABLET | Freq: Every day | ORAL | Status: DC
  Administered 2016-07-23 – 2016-07-25 (×3): 81 mg via ORAL
  Filled 2016-07-22 (×3): qty 1

## 2016-07-22 MED ORDER — ENOXAPARIN SODIUM 40 MG/0.4ML ~~LOC~~ SOLN
40.0000 mg | SUBCUTANEOUS | Status: DC
  Administered 2016-07-22 – 2016-07-24 (×3): 40 mg via SUBCUTANEOUS
  Filled 2016-07-22 (×3): qty 0.4

## 2016-07-22 MED ORDER — OXYCODONE-ACETAMINOPHEN 5-325 MG PO TABS: 1.0000 | ORAL_TABLET | ORAL | Status: DC | PRN

## 2016-07-22 MED ORDER — SODIUM CHLORIDE 0.9 % IV SOLN
INTRAVENOUS | Status: DC
  Administered 2016-07-22 – 2016-07-24 (×3): via INTRAVENOUS

## 2016-07-22 MED ORDER — INSULIN ASPART 100 UNIT/ML ~~LOC~~ SOLN
0.0000 [IU] | Freq: Three times a day (TID) | SUBCUTANEOUS | Status: DC
  Administered 2016-07-23: 2 [IU] via SUBCUTANEOUS
  Administered 2016-07-23: 3 [IU] via SUBCUTANEOUS
  Administered 2016-07-23: 8 [IU] via SUBCUTANEOUS
  Administered 2016-07-24: 2 [IU] via SUBCUTANEOUS

## 2016-07-22 MED ORDER — ATORVASTATIN CALCIUM 40 MG PO TABS
40.0000 mg | ORAL_TABLET | Freq: Every day | ORAL | Status: DC
  Administered 2016-07-23 – 2016-07-25 (×3): 40 mg via ORAL
  Filled 2016-07-22 (×3): qty 1

## 2016-07-22 MED ORDER — EZETIMIBE 10 MG PO TABS
10.0000 mg | ORAL_TABLET | Freq: Every day | ORAL | Status: DC
  Administered 2016-07-23 – 2016-07-25 (×3): 10 mg via ORAL
  Filled 2016-07-22 (×4): qty 1

## 2016-07-22 MED ORDER — SODIUM CHLORIDE 0.9 % IV SOLN: 250.0000 mL | INTRAVENOUS | Status: DC | PRN

## 2016-07-22 NOTE — Progress Notes (Signed)
CRITICAL VALUE ALERT  Critical Value:  Lactic acid 3.6 (increased from 2.1 at 1912)  Date & Time Notied:  07/22/16  2315  Provider Notified: Romilda GarretELink- [place]  Orders Received/Actions taken: repeat lactic in AM w/morning labs

## 2016-07-22 NOTE — Progress Notes (Signed)
eLink Physician-Brief Progress Note Patient Name: Steffanie RainwaterJames S Schaum DOB: 1950/01/24 MRN: [id number]   Date of Service  07/22/2016  HPI/Events of Note  Bladder pain - Patient on Pyridium 200 mg PO Q 6 hours at home.  eICU Interventions  Will order Pyridium 200 mg PO Q 8 hours.     Intervention Category Intermediate Interventions: Pain - evaluation and management  Beyla Loney Eugene 07/22/2016, 10:29 PM

## 2016-07-22 NOTE — Progress Notes (Signed)
CRITICAL VALUE ALERT  Critical Value:  Lactic acid 2.1  Date & Time Notied:  07/22/16  Provider Notified: Pola CornELink  Orders Received/Actions taken: none given, will continue to monitor

## 2016-07-22 NOTE — Progress Notes (Signed)
eLink Physician-Brief Progress Note Patient Name: Keith Ortiz DOB: 1950/05/29 MRN: [id number]   Date of Service  07/22/2016  HPI/Events of Note  Lactate trending down 3.6 to 2.1 , then back up BP ok, making urine , cr improved satn slight low  During sleep - ? Undiagnosed OSA Renal US - no hnosis  eICU Interventions  Rpt lactate in am No indication for pressors Ct cefepime     Intervention Category Intermediate Interventions: Diagnostic test evaluation  Emalea Mix V. 07/22/2016, 11:24 PM

## 2016-07-22 NOTE — Progress Notes (Signed)
Pharmacy Antibiotic Note  Keith Ortiz is a 66 y.o. male admitted on 07/22/2016 with UTI.  Pharmacy has been consulted for cefepime dosing.   [name]    Plan:  Cefepime 2 gr IV x1, then 1 gr IV q24h.   Monitor clinical course, renal function, cultures as available   Height: 6' (182.9 cm) Weight: 212 lb (96.2 kg) IBW/kg (Calculated) : 77.6  Temp (24hrs), Avg:100 F (37.8 C), Min:98.1 F (36.7 C), Max:102.9 F (39.4 C)   Recent Labs Lab 07/18/16 1149 07/22/16 1100 07/22/16 1111  WBC 9.4 35.5*  --   CREATININE  --  2.43*  --   LATICACIDVEN  --   --  3.64*    Estimated Creatinine Clearance: 37.4 mL/min (A) (by C-G formula based on SCr of 2.43 mg/dL (H)).    No Known Allergies  Antimicrobials this admission: 7/9 cefepime >>    Dose adjustments this admission: ---  Microbiology results: 7/9 BCx: sent 7/9 Urine: sent  Thank you for allowing pharmacy to be a part of this patient's care.  Adalberto ColeNikola Holiday Mcmenamin, PharmD, BCPS Pager [phone] 07/22/2016 12:53 PM

## 2016-07-22 NOTE — H&P (Signed)
PULMONARY / CRITICAL CARE MEDICINE   Name: Steffanie RainwaterJames S Peplinski MRN: [id number] DOB: [birth year]    ADMISSION DATE:  07/22/2016 CONSULTATION DATE:  07/22/2016   REFERRING MD:  Rubin PayorPickering  CHIEF COMPLAINT:  Weakness  HISTORY OF PRESENT ILLNESS:   66 y/o male with a past medical history significant for DM2 and nephrolithiasis who [facility] on 07/22/2016 with weakness, chills, and nausea. He says that he has dealt with kidney stones for years but recently he had a large one which required a nephrostomy tube.  This was internalized on 7/5 and he felt well afterwards.  However over the weekend he developed lethargy, nausea, and chills and felt like he was getting an infection.  He called urology on 7/8 and they recommended an ER visit.  He waited until today and went to urgent care where he was noted to be in septic shock.  He hasn't had much to eat since yesterday afternoon.  He denies dyspnea, rash, or chest pain.  No diarrhea.  No vomiting.  PAST MEDICAL HISTORY :  He  has a past medical history of Arthritis; Diabetes mellitus without complication (HCC); History of kidney stones; HLD (hyperlipidemia); Hypertension; and Pneumonia.  PAST SURGICAL HISTORY: He  has a past surgical history that includes Tonsillectomy; Lithotripsy; Bladder stone removal; Fracture surgery; Hernia repair; Nephrolithotomy (Left, 07/08/2016); Cystoscopy with stent placement (Left, 07/08/2016); and Nephrolithotomy (Left, 07/18/2016).  No Known Allergies  No current facility-administered medications on file prior to encounter.    Current Outpatient Prescriptions on File Prior to Encounter  Medication Sig  . aspirin EC 81 MG tablet Take 81 mg by mouth daily.  Marland Kitchen. atorvastatin (LIPITOR) 40 MG tablet Take 40 mg by mouth daily.  Marland Kitchen. ezetimibe (ZETIA) 10 MG tablet Take 10 mg by mouth daily.  . metFORMIN (GLUCOPHAGE-XR) 500 MG 24 hr tablet Take 500 mg by mouth daily.  Marland Kitchen. oxyCODONE-acetaminophen (PERCOCET/ROXICET) 5-325 MG tablet Take  1-2 tablets by mouth every 4 (four) hours as needed for moderate pain.  . valsartan-hydrochlorothiazide (DIOVAN-HCT) 160-12.5 MG tablet Take 1 tablet by mouth daily.    FAMILY HISTORY:  His has no family status information on file.    SOCIAL HISTORY: He  reports that he has never smoked. He has never used smokeless tobacco. He reports that he drinks alcohol. He reports that he does not use drugs.  REVIEW OF SYSTEMS:   Gen: per HPI HEENT: Denies blurred vision, double vision, hearing loss, tinnitus, sinus congestion, rhinorrhea, sore throat, neck stiffness, dysphagia PULM: Denies shortness of breath, cough, sputum production, hemoptysis, wheezing CV: Denies chest pain, edema, orthopnea, paroxysmal nocturnal dyspnea, palpitations GI: Denies abdominal pain, nausea, vomiting, diarrhea, hematochezia, melena, constipation, change in bowel habits GU: Denies dysuria, hematuria, polyuria, oliguria, urethral discharge Endocrine: Denies hot or cold intolerance, polyuria, polyphagia or appetite change Derm: Denies rash, dry skin, scaling or peeling skin change Heme: Denies easy bruising, bleeding, bleeding gums Neuro: Denies headache, numbness, weakness, slurred speech, loss of memory or consciousness   SUBJECTIVE:  As above  VITAL SIGNS: BP 111/69   Pulse (!) 130   Temp (!) 102.9 F (39.4 C)   Resp (!) 22   Ht 6' (1.829 m)   Wt 96.2 kg (212 lb)   SpO2 96%   BMI 28.75 kg/m   HEMODYNAMICS:    VENTILATOR SETTINGS:    INTAKE / OUTPUT: No intake/output data recorded.  PHYSICAL EXAMINATION:  General:  Resting comfortably in bed HENT: NCAT OP clear PULM: CTA B,  normal effort CV: RRR, no mgr GI: BS+, soft, nontender MSK: normal bulk and tone Neuro: awake, alert, no distress, MAEW  Sepsis - Repeat Assessment  Performed at:    07/22/2016@3 :17 PM  Vitals     Blood pressure 111/69, pulse (!) 130, temperature (!) 102.9 F (39.4 C), resp. rate (!) 22, height 6' (1.829 m), weight  96.2 kg (212 lb), SpO2 96 %.  Heart:     Regular rate and rhythm  Lungs:    CTA  Capillary Refill:   <2 sec  Peripheral Pulse:   Radial pulse palpable  Skin:     Normal Color     LABS:  BMET  Recent Labs Lab 07/22/16 1100  NA 136  K 5.1  CL 99*  CO2 23  BUN 34*  CREATININE 2.43*  GLUCOSE 261*    Electrolytes  Recent Labs Lab 07/22/16 1100  CALCIUM 9.4    CBC  Recent Labs Lab 07/18/16 1149 07/22/16 1100  WBC 9.4 35.5*  HGB 14.7 13.1  HCT 40.5 37.2*  PLT 304 211    Coag's No results for input(s): APTT, INR in the last 168 hours.  Sepsis Markers  Recent Labs Lab 07/22/16 1111 07/22/16 1424  LATICACIDVEN 3.64* 3.17*    ABG No results for input(s): PHART, PCO2ART, PO2ART in the last 168 hours.  Liver Enzymes  Recent Labs Lab 07/22/16 1100  AST 26  ALT 33  ALKPHOS 59  BILITOT 0.9  ALBUMIN 3.4*    Cardiac Enzymes No results for input(s): TROPONINI, PROBNP in the last 168 hours.  Glucose  Recent Labs Lab 07/18/16 1059 07/18/16 1446  GLUCAP 146* 104*    Imaging US Renal  Result Date: 07/22/2016 CLINICAL DATA:  Status post second look percutaneous left nephrolithotomy on 07/18/2016 for a stone measuring more than 2 cm. EXAM: RENAL / URINARY TRACT ULTRASOUND COMPLETE COMPARISON:  CT abdomen and pelvis 07/11/2016. FINDINGS: Right Kidney: Length: 12.4 cm. Echogenicity within normal limits. No mass or hydronephrosis visualized. Shadowing stones are identified measuring up to 1.2 cm. Left Kidney: Length: 12.1 cm.  Stent is in place.  No hydronephrosis.  No mass. Bladder: Appears normal for degree of bladder distention.  Stent is noted. IMPRESSION: Negative for hydronephrosis with a left double-J ureteral stent in place. No stones are seen on the left. Nonobstructing right renal stones. Electronically Signed   By: Drusilla Kanner M.D.   On: 07/22/2016 13:41   Dg Chest Port 1 View  Result Date: 07/22/2016 CLINICAL DATA:  66 year old  diabetic hypertensive male with nausea and weakness. Possible sepsis. Initial encounter. Initial encounter. EXAM: PORTABLE CHEST 1 VIEW COMPARISON:  None. FINDINGS: Right costophrenic angle not included on present exam. No infiltrate, congestive heart failure or pneumothorax. No plain film evidence of pulmonary malignancy. Patient would benefit from follow-up two-view chest cardiac leads removed when able. Heart size top-normal. IMPRESSION: No infiltrate or pulmonary edema. Heart size top-normal. Electronically Signed   By: Lacy Duverney M.D.   On: 07/22/2016 14:34     STUDIES:  7/9 Renal ultrasound: no hydronephrosis  CULTURES: 7/9 blood > 7/9 urine >   ANTIBIOTICS: 7/9 cefepime>   SIGNIFICANT EVENTS:   LINES/TUBES:   DISCUSSION: 66 y/o male with a past medical history for DM2 here with septic shock after having a urethral stent placed last week. He has recently had left nephrolithiasis managed with a nephrostomy tube which was converted to a ureteral stent last week.  He has sepsis of a urinary origin.  ASSESSMENT / PLAN:  PULMONARY A: No acute issues P:   Monitor respiratory status  CARDIOVASCULAR A:  Severe sepsis, Hypotension responding to IVF Lactic acid improving P:  Tele Continue saline infusion Repeat lactic acid  RENAL A:   Recent nephrolithiasis, ureteral stent > no hydro on 7/9 U/S UTI AKI P:   Continue IVF resuscitation F/u urology recommendations Monitor BMET and UOP Replace electrolytes as needed Consider CT abdomen/pelvis if no improvement or if worse  GASTROINTESTINAL A:   Nausea/vomiting P:   Prn zofran Diabetic diet  HEMATOLOGIC A:   No acute issues P:  Monitor for bleeding  INFECTIOUS A:   UTI Severe Sepsis P:   F/u urine culture, blood culture Continue cefepime for now, could narrow in 24 -48 hours  ENDOCRINE A:   DM2   P:   SSI AC/HS Hold metformin with lactic acidosis  NEUROLOGIC A:   No acute issues P:      FAMILY  - Updates:  Wife updated bedside   My cc time 32 minutes  Heber Knowles, MD [facility] Pager: [phone] Cell: [phone] After 3pm or if no response, call [phone]   07/22/2016, 3:07 PM

## 2016-07-22 NOTE — Consult Note (Signed)
Urology Consult  Referring physician: Dr. Lake Bells Reason for referral: sepsis after PCNL  Chief Complaint: fatigue and left flank pain  History of Present Illness: Mr Staup is a 66yo with a hx of nephrolithiasis who presented to the ER today with a 1 day history of fatigue, chills, and nausea. He underwent a second look PCNL 4 days ago. He was discharged home and then yesterday developed chill with nausea. He presented to urgent care and then was referred to the ER. He has continued left flank pain that is sharp, intermittent moderate, nonraditiaing. No exacerbating/alleviating events. He has associated nausea. He developed gross hematuria yesterday with associated dysuria. Renal US shows no left residual calculi and no hydronephrosis. The stent is seen in the renal pelvis.   Past Medical History:  Diagnosis Date  . Arthritis   . Diabetes mellitus without complication ([place])    type 2  . History of kidney stones   . HLD (hyperlipidemia)   . Hypertension   . Pneumonia    Past Surgical History:  Procedure Laterality Date  . BLADDER STONE REMOVAL    . CYSTOSCOPY WITH STENT PLACEMENT Left 07/08/2016   Procedure: CYSTOSCOPY WITH STENT PLACEMENT;  Surgeon: Cleon Gustin, MD;  Location: [facility];  Service: Urology;  Laterality: Left;  . FRACTURE SURGERY    . HERNIA REPAIR    . LITHOTRIPSY    . NEPHROLITHOTOMY Left 07/08/2016   Procedure: NEPHROLITHOTOMY PERCUTANEOUS;  Surgeon: Cleon Gustin, MD;  Location: [facility];  Service: Urology;  Laterality: Left;  . NEPHROLITHOTOMY Left 07/18/2016   Procedure: NEPHROLITHOTOMY PERCUTANEOUS SECOND LOOK;  Surgeon: Cleon Gustin, MD;  Location: [facility];  Service: Urology;  Laterality: Left;  . TONSILLECTOMY      Medications: I have reviewed the patient's current medications. Allergies: No Known Allergies  No family history on file. Social History:  reports that he has never smoked. He has never used smokeless tobacco. He reports that he  drinks alcohol. He reports that he does not use drugs.  Review of Systems  Constitutional: Positive for chills, fever and malaise/fatigue.  Gastrointestinal: Positive for nausea.  Genitourinary: Positive for dysuria, flank pain and hematuria.  All other systems reviewed and are negative.   Physical Exam:  Vital signs in last 24 hours: Temp:  [98.1 F (36.7 C)-102.9 F (39.4 C)] 98.7 F (37.1 C) (07/09 1928) Pulse Rate:  [88-142] 116 (07/09 1800) Resp:  [15-26] 19 (07/09 1900) BP: (82-150)/(50-82) 82/59 (07/09 1800) SpO2:  [93 %-100 %] 97 % (07/09 1900) Weight:  [96.2 kg (212 lb)] 96.2 kg (212 lb) (07/09 1007) Physical Exam  Constitutional: He is oriented to person, place, and time. He appears well-developed and well-nourished.  HENT:  Head: Normocephalic and atraumatic.  Eyes: EOM are normal. Pupils are equal, round, and reactive to light.  Neck: Normal range of motion. No thyromegaly present.  Cardiovascular: Regular rhythm.   Respiratory: Effort normal. No respiratory distress.  GI: Soft. He exhibits no distension.  Musculoskeletal: Normal range of motion. He exhibits no edema.  Neurological: He is alert and oriented to person, place, and time.  Skin: Skin is warm and dry.  Psychiatric: He has a normal mood and affect. His behavior is normal. Judgment and thought content normal.    Laboratory Data:  Results for orders placed or performed during the hospital encounter of 07/22/16 (from the past 72 hour(s))  Comprehensive metabolic panel     Status: Abnormal   Collection Time: 07/22/16 11:00 AM  Result  Value Ref Range   Sodium 136 135 - 145 mmol/L   Potassium 5.1 3.5 - 5.1 mmol/L   Chloride 99 (L) 101 - 111 mmol/L   CO2 23 22 - 32 mmol/L   Glucose, Bld 261 (H) 65 - 99 mg/dL   BUN 34 (H) 6 - 20 mg/dL   Creatinine, Ser 2.43 (H) 0.61 - 1.24 mg/dL   Calcium 9.4 8.9 - 10.3 mg/dL   Total Protein 6.7 6.5 - 8.1 g/dL   Albumin 3.4 (L) 3.5 - 5.0 g/dL   AST 26 15 - 41 U/L    ALT 33 17 - 63 U/L   Alkaline Phosphatase 59 38 - 126 U/L   Total Bilirubin 0.9 0.3 - 1.2 mg/dL   GFR calc non Af Amer 27 (L) >60 mL/min   GFR calc Af Amer 31 (L) >60 mL/min    Comment: (NOTE) The eGFR has been calculated using the CKD EPI equation. This calculation has not been validated in all clinical situations. eGFR's persistently <60 mL/min signify possible Chronic Kidney Disease.    Anion gap 14 5 - 15  CBC with Differential     Status: Abnormal   Collection Time: 07/22/16 11:00 AM  Result Value Ref Range   WBC 35.5 (H) 4.0 - 10.5 K/uL   RBC 3.84 (L) 4.22 - 5.81 MIL/uL   Hemoglobin 13.1 13.0 - 17.0 g/dL   HCT 37.2 (L) 39.0 - 52.0 %   MCV 96.9 78.0 - 100.0 fL   MCH 34.1 (H) 26.0 - 34.0 pg   MCHC 35.2 30.0 - 36.0 g/dL   RDW 13.3 11.5 - 15.5 %   Platelets 211 150 - 400 K/uL   Neutrophils Relative % 95 %   Lymphocytes Relative 2 %   Monocytes Relative 3 %   Eosinophils Relative 0 %   Basophils Relative 0 %   Neutro Abs 33.7 (H) 1.7 - 7.7 K/uL   Lymphs Abs 0.7 0.7 - 4.0 K/uL   Monocytes Absolute 1.1 (H) 0.1 - 1.0 K/uL   Eosinophils Absolute 0.0 0.0 - 0.7 K/uL   Basophils Absolute 0.0 0.0 - 0.1 K/uL   WBC Morphology INCREASED BANDS (>20% BANDS)     Comment: WHITE COUNT CONFIRMED ON SMEAR  I-Stat CG4 Lactic Acid, ED     Status: Abnormal   Collection Time: 07/22/16 11:11 AM  Result Value Ref Range   Lactic Acid, Venous 3.64 (HH) 0.5 - 1.9 mmol/L  Urinalysis, Routine w reflex microscopic     Status: Abnormal   Collection Time: 07/22/16 12:50 PM  Result Value Ref Range   Color, Urine RED (A) YELLOW   APPearance TURBID (A) CLEAR   Specific Gravity, Urine  1.005 - 1.030    TEST NOT REPORTED DUE TO COLOR INTERFERENCE OF URINE PIGMENT   pH  5.0 - 8.0    TEST NOT REPORTED DUE TO COLOR INTERFERENCE OF URINE PIGMENT   Glucose, UA (A) NEGATIVE mg/dL    TEST NOT REPORTED DUE TO COLOR INTERFERENCE OF URINE PIGMENT   Hgb urine dipstick (A) NEGATIVE    TEST NOT REPORTED DUE TO  COLOR INTERFERENCE OF URINE PIGMENT   Bilirubin Urine (A) NEGATIVE    TEST NOT REPORTED DUE TO COLOR INTERFERENCE OF URINE PIGMENT   Ketones, ur (A) NEGATIVE mg/dL    TEST NOT REPORTED DUE TO COLOR INTERFERENCE OF URINE PIGMENT   Protein, ur (A) NEGATIVE mg/dL    TEST NOT REPORTED DUE TO COLOR INTERFERENCE OF URINE PIGMENT  Nitrite (A) NEGATIVE    TEST NOT REPORTED DUE TO COLOR INTERFERENCE OF URINE PIGMENT   Leukocytes, UA (A) NEGATIVE    TEST NOT REPORTED DUE TO COLOR INTERFERENCE OF URINE PIGMENT   RBC / HPF TOO NUMEROUS TO COUNT 0 - 5 RBC/hpf   WBC, UA TOO NUMEROUS TO COUNT 0 - 5 WBC/hpf   Bacteria, UA MANY (A) NONE SEEN   Squamous Epithelial / LPF NONE SEEN NONE SEEN   Mucous PRESENT   I-Stat CG4 Lactic Acid, ED     Status: Abnormal   Collection Time: 07/22/16  2:24 PM  Result Value Ref Range   Lactic Acid, Venous 3.17 (HH) 0.5 - 1.9 mmol/L  MRSA PCR Screening     Status: None   Collection Time: 07/22/16  5:48 PM  Result Value Ref Range   MRSA by PCR NEGATIVE NEGATIVE    Comment:        The GeneXpert MRSA Assay (FDA approved for NASAL specimens only), is one component of a comprehensive MRSA colonization surveillance program. It is not intended to diagnose MRSA infection nor to guide or monitor treatment for MRSA infections.   CBC     Status: Abnormal   Collection Time: 07/22/16  7:12 PM  Result Value Ref Range   WBC 32.2 (H) 4.0 - 10.5 K/uL   RBC 3.62 (L) 4.22 - 5.81 MIL/uL   Hemoglobin 12.1 (L) 13.0 - 17.0 g/dL   HCT 35.4 (L) 39.0 - 52.0 %   MCV 97.8 78.0 - 100.0 fL   MCH 33.4 26.0 - 34.0 pg   MCHC 34.2 30.0 - 36.0 g/dL   RDW 13.5 11.5 - 15.5 %   Platelets 176 150 - 400 K/uL  Creatinine, serum     Status: Abnormal   Collection Time: 07/22/16  7:12 PM  Result Value Ref Range   Creatinine, Ser 1.94 (H) 0.61 - 1.24 mg/dL   GFR calc non Af Amer 35 (L) >60 mL/min   GFR calc Af Amer 41 (L) >60 mL/min    Comment: (NOTE) The eGFR has been calculated using the CKD  EPI equation. This calculation has not been validated in all clinical situations. eGFR's persistently <60 mL/min signify possible Chronic Kidney Disease.   Lactic acid, plasma     Status: Abnormal   Collection Time: 07/22/16  7:12 PM  Result Value Ref Range   Lactic Acid, Venous 2.1 (HH) 0.5 - 1.9 mmol/L    Comment: CRITICAL RESULT CALLED TO, READ BACK BY AND VERIFIED WITH: A.CLARKE AT 2036 ON 07/22/16 BY N.THOMPSON   Glucose, capillary     Status: Abnormal   Collection Time: 07/22/16  7:33 PM  Result Value Ref Range   Glucose-Capillary 124 (H) 65 - 99 mg/dL   Comment 1 Notify RN    Comment 2 Document in Chart   Glucose, capillary     Status: Abnormal   Collection Time: 07/22/16  9:20 PM  Result Value Ref Range   Glucose-Capillary 159 (H) 65 - 99 mg/dL   Comment 1 Notify RN    Comment 2 Document in Chart    Recent Results (from the past 240 hour(s))  MRSA PCR Screening     Status: None   Collection Time: 07/22/16  5:48 PM  Result Value Ref Range Status   MRSA by PCR NEGATIVE NEGATIVE Final    Comment:        The GeneXpert MRSA Assay (FDA approved for NASAL specimens only), is one component of a comprehensive  MRSA colonization surveillance program. It is not intended to diagnose MRSA infection nor to guide or monitor treatment for MRSA infections.    Creatinine:  Recent Labs  07/22/16 1100 07/22/16 1912  CREATININE 2.43* 1.94*   Baseline Creatinine: 1.5  Impression/Assessment:  65yo with sepsis from a urinary source.  Plan:  1. Sepsis: Management per the ICU team. I discussed removing the stent with the patient and we will schedule this after he has had 14 days of culture specific antibiotics. Urology to continue to follow  Nicolette Bang 07/22/2016, 9:51 PM

## 2016-07-22 NOTE — ED Notes (Signed)
Gave lactic to MD and RN. 

## 2016-07-22 NOTE — ED Notes (Signed)
Lactic given to RN and MD.   

## 2016-07-22 NOTE — ED Triage Notes (Signed)
Patient had recent urology surgeries for kidney stones on past couple weeks. Patient had nausea and weakness and saw PCP today and was given Zofran and told to come to ED for possible sepsis.

## 2016-07-22 NOTE — ED Provider Notes (Signed)
[facility] Provider Note   CSN: [id number] Arrival date & time: 07/22/16  0957     History   Chief Complaint Chief Complaint  Patient presents with  . sent from PCP for possible sepsis  . Fatigue  . Dizziness    HPI Keith Ortiz is a 66 y.o. male.  HPI Patient presents with dysuria and generalized weakness. Has had nausea and vomiting 2. Recent urologic surgeries with nephrostomy and stone retrieval. No current nephrostomy. He was removed by Dr. Ronne Binning 4 days ago. He had been doing well since then but then began to have some fevers. Reportedly up to 99.8 but since then has been on Motrin and Tylenol. Has dysuria. States he feels as if he has a urinary tract infection. States the flank pain is improving. No drainage from the dressing. Has had decreased oral intake the last couple days. States he discussed with urology yesterday and said they couldn't come to the ER yesterday or the office today. States today he was much more nauseous and did not think that they'll make it to the office. Went to an urgent care with the given a shot of Rocephin and Zofran and told him he could be septic. His been feeling dizzy and lightheaded. Past Medical History:  Diagnosis Date  . Arthritis   . Diabetes mellitus without complication ([facility])    type 2  . History of kidney stones   . HLD (hyperlipidemia)   . Hypertension   . Pneumonia     Patient Active Problem List   Diagnosis Date Noted  . Renal calculus, left 07/08/2016    Past Surgical History:  Procedure Laterality Date  . BLADDER STONE REMOVAL    . CYSTOSCOPY WITH STENT PLACEMENT Left 07/08/2016   Procedure: CYSTOSCOPY WITH STENT PLACEMENT;  Surgeon: Malen Gauze, MD;  Location: [facility];  Service: Urology;  Laterality: Left;  . FRACTURE SURGERY    . HERNIA REPAIR    . LITHOTRIPSY    . NEPHROLITHOTOMY Left 07/08/2016   Procedure: NEPHROLITHOTOMY PERCUTANEOUS;  Surgeon: Malen Gauze, MD;  Location: [facility];   Service: Urology;  Laterality: Left;  . NEPHROLITHOTOMY Left 07/18/2016   Procedure: NEPHROLITHOTOMY PERCUTANEOUS SECOND LOOK;  Surgeon: Malen Gauze, MD;  Location: [facility];  Service: Urology;  Laterality: Left;  . TONSILLECTOMY         Home Medications    Prior to Admission medications   Medication Sig Start Date End Date Taking? Authorizing Provider  aspirin EC 81 MG tablet Take 81 mg by mouth daily.   Yes [provider]  atorvastatin (LIPITOR) 40 MG tablet Take 40 mg by mouth daily. 06/05/16  Yes [provider]  cefTRIAXone (ROCEPHIN) 1 g injection Inject 1 g into the muscle once.   Yes [provider]  ezetimibe (ZETIA) 10 MG tablet Take 10 mg by mouth daily. 06/05/16  Yes [provider]  metFORMIN (GLUCOPHAGE-XR) 500 MG 24 hr tablet Take 500 mg by mouth daily. 06/11/16  Yes [provider]  ondansetron (ZOFRAN-ODT) 8 MG disintegrating tablet Take 8 mg by mouth once.   Yes [provider]  oxyCODONE-acetaminophen (PERCOCET/ROXICET) 5-325 MG tablet Take 1-2 tablets by mouth every 4 (four) hours as needed for moderate pain. 07/18/16  Yes McKenzie, Mardene Celeste, MD  valsartan-hydrochlorothiazide (DIOVAN-HCT) 160-12.5 MG tablet Take 1 tablet by mouth daily. 06/05/16  Yes [provider]    Family History No family history on file.  Social History Social History  Substance  Use Topics  . Smoking status: Never Smoker  . Smokeless tobacco: Never Used  . Alcohol use Yes     Comment: occasionally     Allergies   Patient has no known allergies.   Review of Systems Review of Systems  Constitutional: Positive for appetite change, chills and fever.  HENT: Negative for congestion.   Respiratory: Negative for shortness of breath.   Cardiovascular: Negative for chest pain.  Gastrointestinal: Positive for nausea and vomiting.  Genitourinary: Positive for dysuria. Negative for flank pain.  Musculoskeletal: Negative for  back pain.  Skin: Negative for pallor.  Neurological: Negative for numbness.  Psychiatric/Behavioral: Negative for agitation.     Physical Exam Updated Vital Signs BP 137/72   Pulse (!) 139   Temp (!) 102.9 F (39.4 C)   Resp 16   Ht 6' (1.829 m)   Wt 96.2 kg (212 lb)   SpO2 93%   BMI 28.75 kg/m   Physical Exam  Constitutional: He appears well-developed.  HENT:  Head: Atraumatic.  Neck: Neck supple.  Cardiovascular:  Mild tachycardia  Pulmonary/Chest: Effort normal. No respiratory distress.  Abdominal: There is no tenderness.  Genitourinary:  Genitourinary Comments: Left CVA area dressing clean dry and intact.  Musculoskeletal: He exhibits no edema.  Neurological: He is alert.  Skin: Skin is warm. Capillary refill takes less than 2 seconds.  Psychiatric: He has a normal mood and affect.     ED Treatments / Results  Labs (all labs ordered are listed, but only abnormal results are displayed) Labs Reviewed  COMPREHENSIVE METABOLIC PANEL - Abnormal; Notable for the following:       Result Value   Chloride 99 (*)    Glucose, Bld 261 (*)    BUN 34 (*)    Creatinine, Ser 2.43 (*)    Albumin 3.4 (*)    GFR calc non Af Amer 27 (*)    GFR calc Af Amer 31 (*)    All other components within normal limits  CBC WITH DIFFERENTIAL/PLATELET - Abnormal; Notable for the following:    WBC 35.5 (*)    RBC 3.84 (*)    HCT 37.2 (*)    MCH 34.1 (*)    Neutro Abs 33.7 (*)    Monocytes Absolute 1.1 (*)    All other components within normal limits  URINALYSIS, ROUTINE W REFLEX MICROSCOPIC - Abnormal; Notable for the following:    Color, Urine RED (*)    APPearance TURBID (*)    Glucose, UA   (*)    Value: TEST NOT REPORTED DUE TO COLOR INTERFERENCE OF URINE PIGMENT   Hgb urine dipstick   (*)    Value: TEST NOT REPORTED DUE TO COLOR INTERFERENCE OF URINE PIGMENT   Bilirubin Urine   (*)    Value: TEST NOT REPORTED DUE TO COLOR INTERFERENCE OF URINE PIGMENT   Ketones, ur   (*)     Value: TEST NOT REPORTED DUE TO COLOR INTERFERENCE OF URINE PIGMENT   Protein, ur   (*)    Value: TEST NOT REPORTED DUE TO COLOR INTERFERENCE OF URINE PIGMENT   Nitrite   (*)    Value: TEST NOT REPORTED DUE TO COLOR INTERFERENCE OF URINE PIGMENT   Leukocytes, UA   (*)    Value: TEST NOT REPORTED DUE TO COLOR INTERFERENCE OF URINE PIGMENT   Bacteria, UA MANY (*)    All other components within normal limits  I-STAT CG4 LACTIC ACID, ED - Abnormal; Notable for the  following:    Lactic Acid, Venous 3.64 (*)    All other components within normal limits  CULTURE, BLOOD (ROUTINE X 2)  CULTURE, BLOOD (ROUTINE X 2)  URINE CULTURE  I-STAT CG4 LACTIC ACID, ED    EKG  EKG Interpretation None       Radiology [facility]  Result Date: 07/22/2016 CLINICAL DATA:  Status post second look percutaneous left nephrolithotomy on 07/18/2016 for a stone measuring more than 2 cm. EXAM: RENAL / URINARY TRACT ULTRASOUND COMPLETE COMPARISON:  CT abdomen and pelvis 07/11/2016. FINDINGS: Right Kidney: Length: 12.4 cm. Echogenicity within normal limits. No mass or hydronephrosis visualized. Shadowing stones are identified measuring up to 1.2 cm. Left Kidney: Length: 12.1 cm.  Stent is in place.  No hydronephrosis.  No mass. Bladder: Appears normal for degree of bladder distention.  Stent is noted. IMPRESSION: Negative for hydronephrosis with a left double-J ureteral stent in place. No stones are seen on the left. Nonobstructing right renal stones. Electronically Signed   By: Drusilla Kannerhomas  Dalessio M.D.   On: 07/22/2016 13:41    Procedures Procedures (including critical care time)  Medications Ordered in ED Medications  sodium chloride 0.9 % bolus 1,000 mL (not administered)  ceFEPIme (MAXIPIME) 1 g in dextrose 5 % 50 mL IVPB (not administered)  sodium chloride 0.9 % bolus 1,000 mL (0 mLs Intravenous Stopped 07/22/16 1140)  sodium chloride 0.9 % bolus 1,000 mL (0 mLs Intravenous Stopped 07/22/16 1141)  ceFEPIme  (MAXIPIME) 2 g in dextrose 5 % 50 mL IVPB (0 g Intravenous Stopped 07/22/16 1342)  ondansetron (ZOFRAN) injection 4 mg (4 mg Intravenous Given 07/22/16 1248)     Initial Impression / Assessment and Plan / ED Course  I have reviewed the triage vital signs and the nursing notes.  Pertinent labs & imaging results that were available during my care of the patient were reviewed by me and considered in my medical decision making (see chart for details).     Patient with fever and dysuria. Initial coverage with Rocephin at the urgent care increase to cefepime here. Has known ureteral stent had recent nephrostomy. Febrile. Initial blood pressure mildly decreased but improved nicely. He did however have return of his tachycardia with his fever returning. White count is elevated. Lactic acid is elevated but less than 3. Discussed with Dr. Annabell HowellsWrenn. Request renal ultrasound. Creatinine elevated. Requested admission to internal medicine but he will follow. Sepsis from UTI. Urine shows infection. 30/kg fluid bolus given.  CRITICAL CARE Performed by: Billee CashingPICKERING,Jathniel Smeltzer R. Total critical care time:30 minutes Critical care time was exclusive of separately billable procedures and treating other patients. Critical care was necessary to treat or prevent imminent or life-threatening deterioration. Critical care was time spent personally by me on the following activities: development of treatment plan with patient and/or surrogate as well as nursing, discussions with consultants, evaluation of patient's response to treatment, examination of patient, obtaining history from patient or surrogate, ordering and performing treatments and interventions, ordering and review of laboratory studies, ordering and review of radiographic studies, pulse oximetry and re-evaluation of patient's condition.   Final Clinical Impressions(s) / ED Diagnoses   Final diagnoses:  Sepsis, due to unspecified organism [facility](HCC)  Urinary tract infection  without hematuria, site unspecified    New Prescriptions New Prescriptions   No medications on file     Benjiman CorePickering, Arnelle Nale, MD 07/22/16 1353

## 2016-07-23 DIAGNOSIS — N390 Urinary tract infection, site not specified: Secondary | ICD-10-CM

## 2016-07-23 DIAGNOSIS — E1169 Type 2 diabetes mellitus with other specified complication: Secondary | ICD-10-CM

## 2016-07-23 DIAGNOSIS — E785 Hyperlipidemia, unspecified: Secondary | ICD-10-CM

## 2016-07-23 LAB — CBC
HCT: 32.2 % — ABNORMAL LOW (ref 39.0–52.0)
Hemoglobin: 11 g/dL — ABNORMAL LOW (ref 13.0–17.0)
MCH: 33 pg (ref 26.0–34.0)
MCHC: 34.2 g/dL (ref 30.0–36.0)
MCV: 96.7 fL (ref 78.0–100.0)
PLATELETS: 162 10*3/uL (ref 150–400)
RBC: 3.33 MIL/uL — ABNORMAL LOW (ref 4.22–5.81)
RDW: 13.7 % (ref 11.5–15.5)
WBC: 24.1 10*3/uL — AB (ref 4.0–10.5)

## 2016-07-23 LAB — GLUCOSE, CAPILLARY
GLUCOSE-CAPILLARY: 144 mg/dL — AB (ref 65–99)
GLUCOSE-CAPILLARY: 293 mg/dL — AB (ref 65–99)
GLUCOSE-CAPILLARY: 158 mg/dL — AB (ref 65–99)
Glucose-Capillary: 155 mg/dL — ABNORMAL HIGH (ref 65–99)

## 2016-07-23 LAB — BASIC METABOLIC PANEL
ANION GAP: 7 (ref 5–15)
BUN: 26 mg/dL — ABNORMAL HIGH (ref 6–20)
CALCIUM: 8 mg/dL — AB (ref 8.9–10.3)
CO2: 22 mmol/L (ref 22–32)
Chloride: 106 mmol/L (ref 101–111)
Creatinine, Ser: 1.63 mg/dL — ABNORMAL HIGH (ref 0.61–1.24)
GFR, EST AFRICAN AMERICAN: 50 mL/min — AB (ref >60)
GFR, EST NON AFRICAN AMERICAN: 43 mL/min — AB (ref >60)
GLUCOSE: 157 mg/dL — AB (ref 65–99)
Potassium: 4.1 mmol/L (ref 3.5–5.1)
SODIUM: 135 mmol/L (ref 135–145)

## 2016-07-23 LAB — HIV ANTIBODY (ROUTINE TESTING W REFLEX): HIV SCREEN 4TH GENERATION: NONREACTIVE

## 2016-07-23 LAB — LACTIC ACID, PLASMA: LACTIC ACID, VENOUS: 1.7 mmol/L (ref 0.5–1.9)

## 2016-07-23 MED ORDER — CEFTRIAXONE SODIUM 1 G IJ SOLR
1.0000 g | INTRAMUSCULAR | Status: DC
  Administered 2016-07-23: 1 g via INTRAVENOUS
  Filled 2016-07-23 (×2): qty 10

## 2016-07-23 MED ORDER — DEXTROSE 5 % IV SOLN
2.0000 g | INTRAVENOUS | Status: DC
  Filled 2016-07-23: qty 2

## 2016-07-23 NOTE — Progress Notes (Signed)
PROGRESS NOTE    Keith Ortiz  VWU:[id number] DOB: 12-Aug-1950 DOA: 07/22/2016 PCP: Iona Hansen, NP    Brief Narrative:  66 year old male who presents with weakness. Patient is known to have diabetes mellitus type 2 and nephrolithiasis. Over last 48 hours prior to hospitalization, he developed lethargy, nausea and chills. On July 5 patient underwent nephrolithotomy with percutaneous second look on left. Due to worsening symptoms he presented to urgent care, he was found hypotensive and transferred to the hospital for further evaluation. On initial physical examination blood pressure 111/69, heart rate 130, temperature 102.9, respiratory rate 22, oxygen saturation 96%. Moist mucous membranes, lungs were clear to auscultation bilaterally no wheezing rales or rhonchi, heart S1-S2 present rhythmic no rubs or gallops or murmurs, abdomen soft nontender, positive bowel sounds, no lower extremity edema. Sodium 136, potassium 5.1, chloride 99, bicarbonate 23, glucose 261, BUN 34, creatinine 2.43, white count 35.5, hemoglobin 13.1, hematocrit 37.2, platelets 211, lactic acid 3.6, urinalysis with too numerous to count white cells. Chest x-ray negative for infiltrates. Renal ultrasound negative for hydronephrosis, left double-J ureteral stent in place, no stones seen on the left, nonobstructing right renal stones. EKG was sinus tachycardia, 134 bpm.  Patient was admitted to the hospital with the working diagnosis of sepsis due to urinary tract infection, complicated by acute kidney injury, and hyperkalemia.    Assessment & Plan:   Active Problems:   Severe sepsis (HCC)   1. Sepsis due to tract infection, present on admission. Old records personally reviewed, no evidence of past infection with pseudomonas, will change antibiotic therapy to ceftriaxone and continue to follow up on cell count, cultures and temperature curve. Will continue hydration with isotonic saline, no signs of volume overload, MAP above  65. Renal US with no hydronephrosis.   2. Acute kidney injury with hyperkalemia. Serum cr down to 1,6 from 1,9, will continue IV fluids with saline at 75 ml per hour, follow renal panel in am, avoid hypotension and nephrotoxic medications, serum K at 4,1 and serum bicarbonate at 22.   3. Type 2 diabetes mellitus. Will continue glucose cover and monitoring with Insulin sliding scale, patient tolerating po well, capillary glucose 159, 144, 293.   4. Dyslipidemia. Continue statin therapy with atorvastatin and ezetimibide.    DVT prophylaxis: enoxaparin  Code Status: full  Family Communication: no family at the bedside  Disposition Plan: home    Consultants:     Procedures:     Antimicrobials:   Ceftriaxone IV    Subjective: Patient with mild nausea, no vomiting, no chest pain or dyspnea, no dysuria.   Objective: Vitals:   07/23/16 0400 07/23/16 0500 07/23/16 0600 07/23/16 0700  BP: (!) 92/55 (!) 87/59 103/62 105/61  Pulse:      Resp: (!) 21 20 17 17   Temp:   98.3 F (36.8 C)   TempSrc:   Oral   SpO2: 91% 91% 95% 95%  Weight:  96.2 kg (212 lb 1.3 oz)    Height:        Intake/Output Summary (Last 24 hours) at 07/23/16 0838 Last data filed at 07/23/16 0619  Gross per 24 hour  Intake             2030 ml  Output              975 ml  Net             1055 ml   Filed Weights   07/22/16 1007 07/23/16 0500  Weight: 96.2 kg (212 lb) 96.2 kg (212 lb 1.3 oz)    Examination:  General exam: not in pain or dyspnea E ENT. No pallor or icterus, oral mucosa moist.  Respiratory system: Clear to auscultation. Respiratory effort normal. No wheezing, rales or rhonchi.  Cardiovascular system: S1 & S2 heard, RRR. No JVD, murmurs, rubs, gallops or clicks. No pedal edema. Gastrointestinal system: Abdomen is nondistended, soft and nontender. No organomegaly or masses felt. Normal bowel sounds heard. Central nervous system: Alert and oriented. No focal neurological  deficits. Extremities: Symmetric 5 x 5 power. Skin: No rashes, lesions or ulcers     Data Reviewed: I have personally reviewed following labs and imaging studies  CBC:  Recent Labs Lab 07/18/16 1149 07/22/16 1100 07/22/16 1912 07/23/16 0323  WBC 9.4 35.5* 32.2* 24.1*  NEUTROABS  --  33.7*  --   --   HGB 14.7 13.1 12.1* 11.0*  HCT 40.5 37.2* 35.4* 32.2*  MCV 94.2 96.9 97.8 96.7  PLT 304 211 176 162   Basic Metabolic Panel:  Recent Labs Lab 07/22/16 1100 07/22/16 1912 07/23/16 0323  NA 136  --  135  K 5.1  --  4.1  CL 99*  --  106  CO2 23  --  22  GLUCOSE 261*  --  157*  BUN 34*  --  26*  CREATININE 2.43* 1.94* 1.63*  CALCIUM 9.4  --  8.0*   GFR: Estimated Creatinine Clearance: 55.8 mL/min (A) (by C-G formula based on SCr of 1.63 mg/dL (H)). Liver Function Tests:  Recent Labs Lab 07/22/16 1100  AST 26  ALT 33  ALKPHOS 59  BILITOT 0.9  PROT 6.7  ALBUMIN 3.4*   No results for input(s): LIPASE, AMYLASE in the last 168 hours. No results for input(s): AMMONIA in the last 168 hours. Coagulation Profile: No results for input(s): INR, PROTIME in the last 168 hours. Cardiac Enzymes: No results for input(s): CKTOTAL, CKMB, CKMBINDEX, TROPONINI in the last 168 hours. BNP (last 3 results) No results for input(s): PROBNP in the last 8760 hours. HbA1C: No results for input(s): HGBA1C in the last 72 hours. CBG:  Recent Labs Lab 07/18/16 1059 07/18/16 1446 07/22/16 1933 07/22/16 2120 07/23/16 0804  GLUCAP 146* 104* 124* 159* 144*   Lipid Profile: No results for input(s): CHOL, HDL, LDLCALC, TRIG, CHOLHDL, LDLDIRECT in the last 72 hours. Thyroid Function Tests: No results for input(s): TSH, T4TOTAL, FREET4, T3FREE, THYROIDAB in the last 72 hours. Anemia Panel: No results for input(s): VITAMINB12, FOLATE, FERRITIN, TIBC, IRON, RETICCTPCT in the last 72 hours. Sepsis Labs:  Recent Labs Lab 07/22/16 1424 07/22/16 1912 07/22/16 2227 07/23/16 0323   LATICACIDVEN 3.17* 2.1* 3.6* 1.7    Recent Results (from the past 240 hour(s))  MRSA PCR Screening     Status: None   Collection Time: 07/22/16  5:48 PM  Result Value Ref Range Status   MRSA by PCR NEGATIVE NEGATIVE Final    Comment:        The GeneXpert MRSA Assay (FDA approved for NASAL specimens only), is one component of a comprehensive MRSA colonization surveillance program. It is not intended to diagnose MRSA infection nor to guide or monitor treatment for MRSA infections.          Radiology Studies: US Renal  Result Date: 07/22/2016 CLINICAL DATA:  Status post second look percutaneous left nephrolithotomy on 07/18/2016 for a stone measuring more than 2 cm. EXAM: RENAL / URINARY TRACT ULTRASOUND COMPLETE COMPARISON:  CT abdomen  and pelvis 07/11/2016. FINDINGS: Right Kidney: Length: 12.4 cm. Echogenicity within normal limits. No mass or hydronephrosis visualized. Shadowing stones are identified measuring up to 1.2 cm. Left Kidney: Length: 12.1 cm.  Stent is in place.  No hydronephrosis.  No mass. Bladder: Appears normal for degree of bladder distention.  Stent is noted. IMPRESSION: Negative for hydronephrosis with a left double-J ureteral stent in place. No stones are seen on the left. Nonobstructing right renal stones. Electronically Signed   By: Drusilla Kannerhomas  Dalessio M.D.   On: 07/22/2016 13:41   Dg Chest Port 1 View  Result Date: 07/22/2016 CLINICAL DATA:  66 year old diabetic hypertensive male with nausea and weakness. Possible sepsis. Initial encounter. Initial encounter. EXAM: PORTABLE CHEST 1 VIEW COMPARISON:  None. FINDINGS: Right costophrenic angle not included on present exam. No infiltrate, congestive heart failure or pneumothorax. No plain film evidence of pulmonary malignancy. Patient would benefit from follow-up two-view chest cardiac leads removed when able. Heart size top-normal. IMPRESSION: No infiltrate or pulmonary edema. Heart size top-normal. Electronically Signed    By: Lacy DuverneySteven  Olson M.D.   On: 07/22/2016 14:34        Scheduled Meds: . aspirin EC  81 mg Oral Daily  . atorvastatin  40 mg Oral Daily  . enoxaparin (LOVENOX) injection  40 mg Subcutaneous Q24H  . ezetimibe  10 mg Oral Daily  . insulin aspart  0-15 Units Subcutaneous TID WC  . insulin aspart  0-5 Units Subcutaneous QHS  . phenazopyridine  200 mg Oral TID WC   Continuous Infusions: . sodium chloride    . sodium chloride 100 mL/hr at 07/23/16 0619  . ceFEPime (MAXIPIME) IV       LOS: 1 day        Coralie KeensMauricio Daniel Ji Feldner, MD [facility] Pager [phone]  If 7PM-7AM, please contact night-coverage [email or web address] Password [facility] [email or web address] 07/23/2016, 8:38 AM

## 2016-07-23 NOTE — Progress Notes (Signed)
Pt arrived to the floor via wheelchair and walked to the bed with standby assist. Pt denies pain at the time of transfer with no s/s of distress noted. I have reviewed the morning assessment and agree with the morning RN. Call bell is within reach.

## 2016-07-23 NOTE — Progress Notes (Signed)
Pharmacy Antibiotic Note  Keith RainwaterJames S Mccutchan is a 66 y.o. male admitted on 07/22/2016 with UTI.  Pharmacy has been consulted for cefepime dosing.   Patient with two recent urologic surgeries with nephrostomy and stone retrieval. No current nephrostomy. It was removed by Dr. Ronne BinningMcKenzie 4 days ago. He had been doing well since then but then began to have some fevers.    Plan:  Due to improved renal function, change cefepime from 1g to 2g IV q24  Monitor clinical course, renal function, cultures as available   Height: 6' (182.9 cm) Weight: 212 lb 1.3 oz (96.2 kg) IBW/kg (Calculated) : 77.6  Temp (24hrs), Avg:100.2 F (37.9 C), Min:98.1 F (36.7 C), Max:102.9 F (39.4 C)   Recent Labs Lab 07/18/16 1149 07/22/16 1100 07/22/16 1111 07/22/16 1424 07/22/16 1912 07/22/16 2227 07/23/16 0323  WBC 9.4 35.5*  --   --  32.2*  --  24.1*  CREATININE  --  2.43*  --   --  1.94*  --  1.63*  LATICACIDVEN  --   --  3.64* 3.17* 2.1* 3.6* 1.7    Estimated Creatinine Clearance: 55.8 mL/min (A) (by C-G formula based on SCr of 1.63 mg/dL (H)).    No Known Allergies  Antimicrobials this admission: 7/9 cefepime >>    Microbiology results: 7/9 BCx: sent 7/9 Urine: sent  Thank you for allowing pharmacy to be a part of this patient's care.  Hessie KnowsJustin M Salimata Christenson, PharmD, BCPS Pager [phone] 07/23/2016 8:31 AM

## 2016-07-24 LAB — GLUCOSE, CAPILLARY
GLUCOSE-CAPILLARY: 130 mg/dL — AB (ref 65–99)
Glucose-Capillary: 118 mg/dL — ABNORMAL HIGH (ref 65–99)
Glucose-Capillary: 120 mg/dL — ABNORMAL HIGH (ref 65–99)
Glucose-Capillary: 168 mg/dL — ABNORMAL HIGH (ref 65–99)

## 2016-07-24 LAB — BLOOD CULTURE ID PANEL (REFLEXED)

## 2016-07-24 LAB — BASIC METABOLIC PANEL
Anion gap: 11 (ref 5–15)
BUN: 21 mg/dL — ABNORMAL HIGH (ref 6–20)
CHLORIDE: 106 mmol/L (ref 101–111)
CO2: 22 mmol/L (ref 22–32)
CREATININE: 1.14 mg/dL (ref 0.61–1.24)
Calcium: 8.4 mg/dL — ABNORMAL LOW (ref 8.9–10.3)
GFR calc non Af Amer: >60 mL/min (ref >60)
Glucose, Bld: 127 mg/dL — ABNORMAL HIGH (ref 65–99)
Potassium: 3.8 mmol/L (ref 3.5–5.1)
Sodium: 139 mmol/L (ref 135–145)

## 2016-07-24 LAB — CBC WITH DIFFERENTIAL/PLATELET
Basophils Absolute: 0 K/uL (ref 0.0–0.1)
Basophils Relative: 0 %
Eosinophils Absolute: 0 K/uL (ref 0.0–0.7)
Eosinophils Relative: 0 %
HCT: 29.6 % — ABNORMAL LOW (ref 39.0–52.0)
Hemoglobin: 10.4 g/dL — ABNORMAL LOW (ref 13.0–17.0)
Lymphocytes Relative: 8 %
Lymphs Abs: 1.2 K/uL (ref 0.7–4.0)
MCH: 33.2 pg (ref 26.0–34.0)
MCHC: 35.1 g/dL (ref 30.0–36.0)
MCV: 94.6 fL (ref 78.0–100.0)
Monocytes Absolute: 1.1 K/uL — ABNORMAL HIGH (ref 0.1–1.0)
Monocytes Relative: 8 %
Neutro Abs: 11.9 K/uL — ABNORMAL HIGH (ref 1.7–7.7)
Neutrophils Relative %: 84 %
Platelets: 161 K/uL (ref 150–400)
RBC: 3.13 MIL/uL — ABNORMAL LOW (ref 4.22–5.81)
RDW: 13.5 % (ref 11.5–15.5)
WBC: 14.3 K/uL — ABNORMAL HIGH (ref 4.0–10.5)

## 2016-07-24 LAB — URINE CULTURE: Culture: >=100000 — AB

## 2016-07-24 MED ORDER — SODIUM CHLORIDE 0.9 % IV SOLN
2.0000 g | Freq: Four times a day (QID) | INTRAVENOUS | Status: DC
  Administered 2016-07-24 – 2016-07-25 (×4): 2 g via INTRAVENOUS
  Filled 2016-07-24 (×6): qty 2000

## 2016-07-24 NOTE — Progress Notes (Signed)
PHARMACY - PHYSICIAN COMMUNICATION CRITICAL VALUE ALERT - BLOOD CULTURE IDENTIFICATION (BCID)  Results for orders placed or performed during the hospital encounter of 07/22/16  Blood Culture ID Panel (Reflexed) (Collected: 07/22/2016 11:50 AM)  Result Value Ref Range   Enterococcus species NOT DETECTED NOT DETECTED   Listeria monocytogenes NOT DETECTED NOT DETECTED   Staphylococcus species DETECTED (A) NOT DETECTED   Staphylococcus aureus NOT DETECTED NOT DETECTED   Methicillin resistance NOT DETECTED NOT DETECTED   Streptococcus species NOT DETECTED NOT DETECTED   Streptococcus agalactiae NOT DETECTED NOT DETECTED   Streptococcus pneumoniae NOT DETECTED NOT DETECTED   Streptococcus pyogenes NOT DETECTED NOT DETECTED   Acinetobacter baumannii NOT DETECTED NOT DETECTED   Enterobacteriaceae species NOT DETECTED NOT DETECTED   Enterobacter cloacae complex NOT DETECTED NOT DETECTED   Escherichia coli NOT DETECTED NOT DETECTED   Klebsiella oxytoca NOT DETECTED NOT DETECTED   Klebsiella pneumoniae NOT DETECTED NOT DETECTED   Proteus species NOT DETECTED NOT DETECTED   Serratia marcescens NOT DETECTED NOT DETECTED   Haemophilus influenzae NOT DETECTED NOT DETECTED   Neisseria meningitidis NOT DETECTED NOT DETECTED   Pseudomonas aeruginosa NOT DETECTED NOT DETECTED   Candida albicans NOT DETECTED NOT DETECTED   Candida glabrata NOT DETECTED NOT DETECTED   Candida krusei NOT DETECTED NOT DETECTED   Candida parapsilosis NOT DETECTED NOT DETECTED   Candida tropicalis NOT DETECTED NOT DETECTED    Name of physician (or Provider) Contacted: Arrien  Changes to prescribed antibiotics required: none, 1/4 bottles only; treating as contamination for now  Zoila Ditullio A 07/24/2016  3:34 PM

## 2016-07-24 NOTE — Progress Notes (Signed)
Received report from queen, rn. pts assessment remains unchanged from previous documentation

## 2016-07-24 NOTE — Progress Notes (Signed)
PROGRESS NOTE    Keith Ortiz  ZOX:[id number] DOB: Jun 09, 1950 DOA: 07/22/2016 PCP: Iona Hansen, NP    Brief Narrative:  66 year old male who presents with weakness. Patient is known to have diabetes mellitus type 2 and nephrolithiasis. Over last 48 hours prior to hospitalization, he developed lethargy, nausea and chills. On July 5 patient underwent nephrolithotomy with percutaneous second look on left. Due to worsening symptoms he presented to urgent care, he was found hypotensive and transferred to the hospital for further evaluation. On initial physical examination blood pressure 111/69, heart rate 130, temperature 102.9, respiratory rate 22, oxygen saturation 96%. Moist mucous membranes, lungs were clear to auscultation bilaterally no wheezing rales or rhonchi, heart S1-S2 present rhythmic no rubs or gallops or murmurs, abdomen soft nontender, positive bowel sounds, no lower extremity edema. Sodium 136, potassium 5.1, chloride 99, bicarbonate 23, glucose 261, BUN 34, creatinine 2.43, white count 35.5, hemoglobin 13.1, hematocrit 37.2, platelets 211, lactic acid 3.6, urinalysis with too numerous to count white cells. Chest x-ray negative for infiltrates. Renal ultrasound negative for hydronephrosis, left double-J ureteral stent in place, no stones seen on the left, nonobstructing right renal stones. EKG was sinus tachycardia, 134 bpm.  Patient was admitted to the hospital with the working diagnosis of sepsis due to urinary tract infection, complicated by acute kidney injury, and hyperkalemia.     Assessment & Plan:   Active Problems:   Severe sepsis (HCC)   1. Sepsis due to urinary tract infection, present on admission. Urine culture positive for enterococcus fecalis, sensitive to ampicillin, will narrow antibiotic therapy to IV ampicillin, will follow on cell count and temperature curve, blood cultures with no growth. Patient tolerating po well, will hold on further antibiotic therapy.     2. Acute kidney injury with hyperkalemia. Serum cr down to 1,1 from 1,6. Patient tolerating po well, will hold on further IV fluids, will follow on renal panel in am, avoid hypotension or nephrotoxic medications. Serum K at 3,8 and bicarbonate at 22.   3. Type 2 diabetes mellitus. insulin sliding scale, patient tolerating po well, capillary glucose 155, 158, 118, 130. Patient tolerating po well.    4. Dyslipidemia.  atorvastatin and ezetimibide with good toleration.    DVT prophylaxis: enoxaparin  Code Status: full  Family Communication: no family at the bedside  Disposition Plan: home    Consultants:     Procedures:     Antimicrobials:   Ampicillin IV     Subjective: Patient feeling better, out of bed, no nausea or vomiting, no chest pain or dyspnea, positive bowel movements.   Objective: Vitals:   07/23/16 2038 07/23/16 2135 07/23/16 2138 07/24/16 0556  BP: 107/75   118/64  Pulse: (!) 48 (!) 51 100 93  Resp: 16   18  Temp: 98.6 F (37 C)   98.2 F (36.8 C)  TempSrc: Oral   Oral  SpO2: 96%   97%  Weight:      Height:        Intake/Output Summary (Last 24 hours) at 07/24/16 1139 Last data filed at 07/24/16 0850  Gross per 24 hour  Intake             1820 ml  Output             1626 ml  Net              194 ml   Filed Weights   07/22/16 1007 07/23/16 0500 07/23/16 1833  Weight: 96.2  kg (212 lb) 96.2 kg (212 lb 1.3 oz) 102.2 kg (225 lb 6.4 oz)    Examination:  General exam: Not in pain or dyspnea E ENT: no pallor or icterus, oral mucosa moist.   Respiratory system: Clear to auscultation. Respiratory effort normal. No wheezing, rales or rhonchi.  Cardiovascular system: S1 & S2 heard, RRR. No JVD, murmurs, rubs, gallops or clicks. No pedal edema. Gastrointestinal system: Abdomen is mild distended, soft and nontender. No organomegaly or masses felt. Normal bowel sounds heard. Central nervous system: Alert and oriented. No focal neurological  deficits. Extremities: Symmetric 5 x 5 power. Skin: No rashes, lesions or ulcers     Data Reviewed: I have personally reviewed following labs and imaging studies  CBC:  Recent Labs Lab 07/18/16 1149 07/22/16 1100 07/22/16 1912 07/23/16 0323 07/24/16 0554  WBC 9.4 35.5* 32.2* 24.1* 14.3*  NEUTROABS  --  33.7*  --   --  11.9*  HGB 14.7 13.1 12.1* 11.0* 10.4*  HCT 40.5 37.2* 35.4* 32.2* 29.6*  MCV 94.2 96.9 97.8 96.7 94.6  PLT 304 211 176 162 161   Basic Metabolic Panel:  Recent Labs Lab 07/22/16 1100 07/22/16 1912 07/23/16 0323 07/24/16 0554  NA 136  --  135 139  K 5.1  --  4.1 3.8  CL 99*  --  106 106  CO2 23  --  22 22  GLUCOSE 261*  --  157* 127*  BUN 34*  --  26* 21*  CREATININE 2.43* 1.94* 1.63* 1.14  CALCIUM 9.4  --  8.0* 8.4*   GFR: Estimated Creatinine Clearance: 82 mL/min (by C-G formula based on SCr of 1.14 mg/dL). Liver Function Tests:  Recent Labs Lab 07/22/16 1100  AST 26  ALT 33  ALKPHOS 59  BILITOT 0.9  PROT 6.7  ALBUMIN 3.4*   No results for input(s): LIPASE, AMYLASE in the last 168 hours. No results for input(s): AMMONIA in the last 168 hours. Coagulation Profile: No results for input(s): INR, PROTIME in the last 168 hours. Cardiac Enzymes: No results for input(s): CKTOTAL, CKMB, CKMBINDEX, TROPONINI in the last 168 hours. BNP (last 3 results) No results for input(s): PROBNP in the last 8760 hours. HbA1C: No results for input(s): HGBA1C in the last 72 hours. CBG:  Recent Labs Lab 07/23/16 0804 07/23/16 1210 07/23/16 1633 07/23/16 2137 07/24/16 0732  GLUCAP 144* 293* 155* 158* 118*   Lipid Profile: No results for input(s): CHOL, HDL, LDLCALC, TRIG, CHOLHDL, LDLDIRECT in the last 72 hours. Thyroid Function Tests: No results for input(s): TSH, T4TOTAL, FREET4, T3FREE, THYROIDAB in the last 72 hours. Anemia Panel: No results for input(s): VITAMINB12, FOLATE, FERRITIN, TIBC, IRON, RETICCTPCT in the last 72 hours. Sepsis  Labs:  Recent Labs Lab 07/22/16 1424 07/22/16 1912 07/22/16 2227 07/23/16 0323  LATICACIDVEN 3.17* 2.1* 3.6* 1.7    Recent Results (from the past 240 hour(s))  Culture, blood (routine x 2)     Status: None (Preliminary result)   Collection Time: 07/22/16 11:50 AM  Result Value Ref Range Status   Specimen Description BLOOD LEFT ANTECUBITAL  Final   Special Requests   Final    BOTTLES DRAWN AEROBIC AND ANAEROBIC Blood Culture adequate volume   Culture  Setup Time   Final    GRAM POSITIVE COCCI IN CLUSTERS ANAEROBIC BOTTLE ONLY Organism ID to follow CRITICAL RESULT CALLED TO, READ BACK BY AND VERIFIED WITH: J. Teodoro KilGadhia Pharm.D. 11:15 07/24/16 ([email or web address])    Culture GRAM POSITIVE COCCI  Final  Report Status PENDING  Incomplete  Blood Culture ID Panel (Reflexed)     Status: Abnormal   Collection Time: 07/22/16 11:50 AM  Result Value Ref Range Status   Enterococcus species NOT DETECTED NOT DETECTED Final   Listeria monocytogenes NOT DETECTED NOT DETECTED Final   Staphylococcus species DETECTED (A) NOT DETECTED Final    Comment: Methicillin (oxacillin) susceptible coagulase negative staphylococcus. Possible blood culture contaminant (unless isolated from more than one blood culture draw or clinical case suggests pathogenicity). No antibiotic treatment is indicated for blood  culture contaminants. CRITICAL RESULT CALLED TO, READ BACK BY AND VERIFIED WITH: J. Teodoro Kil Pharm.D. 11:15 07/24/16 ([email or web address])    Staphylococcus aureus NOT DETECTED NOT DETECTED Final   Methicillin resistance NOT DETECTED NOT DETECTED Final   Streptococcus species NOT DETECTED NOT DETECTED Final   Streptococcus agalactiae NOT DETECTED NOT DETECTED Final   Streptococcus pneumoniae NOT DETECTED NOT DETECTED Final   Streptococcus pyogenes NOT DETECTED NOT DETECTED Final   Acinetobacter baumannii NOT DETECTED NOT DETECTED Final   Enterobacteriaceae species NOT DETECTED NOT DETECTED Final   Enterobacter cloacae complex  NOT DETECTED NOT DETECTED Final   Escherichia coli NOT DETECTED NOT DETECTED Final   Klebsiella oxytoca NOT DETECTED NOT DETECTED Final   Klebsiella pneumoniae NOT DETECTED NOT DETECTED Final   Proteus species NOT DETECTED NOT DETECTED Final   Serratia marcescens NOT DETECTED NOT DETECTED Final   Haemophilus influenzae NOT DETECTED NOT DETECTED Final   Neisseria meningitidis NOT DETECTED NOT DETECTED Final   Pseudomonas aeruginosa NOT DETECTED NOT DETECTED Final   Candida albicans NOT DETECTED NOT DETECTED Final   Candida glabrata NOT DETECTED NOT DETECTED Final   Candida krusei NOT DETECTED NOT DETECTED Final   Candida parapsilosis NOT DETECTED NOT DETECTED Final   Candida tropicalis NOT DETECTED NOT DETECTED Final    Comment: Performed at [facility], [address] [place], [place] [address]  Urine culture     Status: Abnormal   Collection Time: 07/22/16 12:50 PM  Result Value Ref Range Status   Specimen Description URINE, CLEAN CATCH  Final   Special Requests NONE  Final   Culture >=100,000 COLONIES/mL ENTEROCOCCUS FAECALIS (A)  Final   Report Status 07/24/2016 FINAL  Final   Organism ID, Bacteria ENTEROCOCCUS FAECALIS (A)  Final      Susceptibility   Enterococcus faecalis - MIC*    AMPICILLIN <=2 SENSITIVE Sensitive     LEVOFLOXACIN 1 SENSITIVE Sensitive     NITROFURANTOIN <=16 SENSITIVE Sensitive     VANCOMYCIN 2 SENSITIVE Sensitive     * >=100,000 COLONIES/mL ENTEROCOCCUS FAECALIS  Culture, blood (routine x 2)     Status: None (Preliminary result)   Collection Time: 07/22/16  1:41 PM  Result Value Ref Range Status   Specimen Description BLOOD LEFT HAND  Final   Special Requests   Final    BOTTLES DRAWN AEROBIC AND ANAEROBIC Blood Culture adequate volume   Culture   Final    NO GROWTH 2 DAYS Performed at [facility], [address], [place], [place] [address]    Report Status PENDING  Incomplete  MRSA PCR Screening     Status: None   Collection Time:  07/22/16  5:48 PM  Result Value Ref Range Status   MRSA by PCR NEGATIVE NEGATIVE Final    Comment:        The GeneXpert MRSA Assay (FDA approved for NASAL specimens only), is one component of a comprehensive MRSA colonization surveillance  program. It is not intended to diagnose MRSA infection nor to guide or monitor treatment for MRSA infections.          Radiology Studies: US Renal  Result Date: 07/22/2016 CLINICAL DATA:  Status post second look percutaneous left nephrolithotomy on 07/18/2016 for a stone measuring more than 2 cm. EXAM: RENAL / URINARY TRACT ULTRASOUND COMPLETE COMPARISON:  CT abdomen and pelvis 07/11/2016. FINDINGS: Right Kidney: Length: 12.4 cm. Echogenicity within normal limits. No mass or hydronephrosis visualized. Shadowing stones are identified measuring up to 1.2 cm. Left Kidney: Length: 12.1 cm.  Stent is in place.  No hydronephrosis.  No mass. Bladder: Appears normal for degree of bladder distention.  Stent is noted. IMPRESSION: Negative for hydronephrosis with a left double-J ureteral stent in place. No stones are seen on the left. Nonobstructing right renal stones. Electronically Signed   By: Drusilla Kanner M.D.   On: 07/22/2016 13:41   Dg Chest Port 1 View  Result Date: 07/22/2016 CLINICAL DATA:  66 year old diabetic hypertensive male with nausea and weakness. Possible sepsis. Initial encounter. Initial encounter. EXAM: PORTABLE CHEST 1 VIEW COMPARISON:  None. FINDINGS: Right costophrenic angle not included on present exam. No infiltrate, congestive heart failure or pneumothorax. No plain film evidence of pulmonary malignancy. Patient would benefit from follow-up two-view chest cardiac leads removed when able. Heart size top-normal. IMPRESSION: No infiltrate or pulmonary edema. Heart size top-normal. Electronically Signed   By: Lacy Duverney M.D.   On: 07/22/2016 14:34        Scheduled Meds: . aspirin EC  81 mg Oral Daily  . atorvastatin  40 mg Oral  Daily  . enoxaparin (LOVENOX) injection  40 mg Subcutaneous Q24H  . ezetimibe  10 mg Oral Daily  . insulin aspart  0-15 Units Subcutaneous TID WC  . insulin aspart  0-5 Units Subcutaneous QHS  . phenazopyridine  200 mg Oral TID WC   Continuous Infusions: . sodium chloride    . sodium chloride 75 mL/hr at 07/24/16 0624  . ampicillin (OMNIPEN) IV       LOS: 2 days        Mauricio Annett Gula, MD [facility] Pager [phone]  If 7PM-7AM, please contact night-coverage [email or web address] Password [facility] 07/24/2016, 11:39 AM

## 2016-07-25 DIAGNOSIS — E875 Hyperkalemia: Secondary | ICD-10-CM

## 2016-07-25 DIAGNOSIS — I10 Essential (primary) hypertension: Secondary | ICD-10-CM

## 2016-07-25 LAB — GLUCOSE, CAPILLARY
GLUCOSE-CAPILLARY: 113 mg/dL — AB (ref 65–99)
Glucose-Capillary: 140 mg/dL — ABNORMAL HIGH (ref 65–99)

## 2016-07-25 MED ORDER — AMOXICILLIN-POT CLAVULANATE 875-125 MG PO TABS
1.0000 | ORAL_TABLET | Freq: Two times a day (BID) | ORAL | Status: DC
  Administered 2016-07-25: 1 via ORAL
  Filled 2016-07-25: qty 1

## 2016-07-25 MED ORDER — AMOXICILLIN-POT CLAVULANATE 875-125 MG PO TABS: 1.0000 | ORAL_TABLET | Freq: Two times a day (BID) | ORAL | 0 refills | Status: AC

## 2016-07-25 NOTE — Care Management Note (Signed)
Case Management Note  Patient Details  Name: Keith Ortiz MRN: [id number] Date of Birth: [birth year]  Subjective/ObjeSteffanie Rainwaterctive:      Infection and weakness              Action/Plan: Date:  July 25, 2016  Chart reviewed for concurrent status and case management needs.  Will continue to follow patient progress.  Discharge Planning: following for needs  Expected discharge date: [id number]  [name], BSN, [place]3, [place]   [phone]   Expected Discharge Date:   (unknown)               Expected Discharge Plan:  Home/Self Care  In-House Referral:     Discharge planning Services  CM Consult  Post Acute Care Choice:    Choice offered to:     DME Arranged:    DME Agency:     HH Arranged:    HH Agency:     Status of Service:  In process, will continue to follow  If discussed at Long Length of Stay Meetings, dates discussed:    Additional Comments:  [name], [name], RN 07/25/2016, 10:16 AM

## 2016-07-25 NOTE — Discharge Summary (Signed)
Physician Discharge Summary  Steffanie RainwaterJames S Isaacks ZOX:[id number] DOB: 10-05-1950 DOA: 07/22/2016  PCP: Iona HansenJones, Penny L, NP  Admit date: 07/22/2016 Discharge date: 07/25/2016  Admitted From: Home Disposition:  Home   Recommendations for Outpatient Follow-up:  1. Follow up with PCP in 1- weeks 2. Patient has been placed on Augmentin for the next 12 days.  3. Follow-up with urology after 14 days of culture specific antibiotics, plan to remove stent as an outpatient.  4. Resume antihypertensives agents once blood pressure systolic 140.   Home Health: No  Equipment/Devices: No   Discharge Condition: Stable  CODE STATUS: Full  Diet recommendation: Heart Healthy / Carb Modified  Brief/Interim Summary: [age] year old male who presents with weakness. Patient is known to have diabetes mellitus type 2 and nephrolithiasis. Over last 48 hours prior to hospitalization, he developed lethargy, nausea and chills. On July 5 patient underwent nephrolithotomy with percutaneous second look on left. Due to worsening symptoms he presented to urgent care, he was found hypotensive and transferred to the hospital for further evaluation. On initial physical examination blood pressure 111/69, heart rate 130, temperature 102.9, respiratory rate 22, oxygen saturation 96%. Moist mucous membranes, lungs were clear to auscultation bilaterally no wheezing rales or rhonchi, heart S1-S2 present rhythmic no rubs or gallops or murmurs, abdomen soft nontender, positive bowel sounds, no lower extremity edema. Sodium 136, potassium 5.1, chloride 99, bicarbonate 23, glucose 261, BUN 34, creatinine 2.43, white count 35.5, hemoglobin 13.1, hematocrit 37.2, platelets 211, lactic acid 3.6, urinalysis with too numerous to count white cells. Chest x-ray negative for infiltrates. Renal ultrasound negative for hydronephrosis, left double-J ureteral stent in place, no stones seen on the left, nonobstructing right renal stones. EKG was sinus tachycardia, 134  bpm.  Patient was admitted to the hospital with the working diagnosis of sepsis due to urinary tract infection, complicated by acute kidney injury, and hyperkalemia.  1. Severe sepsis due to complicated urinary tract infection, present on admission. Due to Enterococcus faecalis. Patient was admitted to the stepdown unit, he was seen by critical care team. Patient received IV isotonic solutions with recovery of his hemodynamic stability, he was treated with broad spectrum  antibiotics and cultures were obtained. Renal ultrasonography with no obstructive uropathy. Urine culture positive for Enterococcus faecalis which was sensitive to ampicillin, antibiotic therapy was narrowed, patient will be discharged on Augmentin for the next 12 days. The patient was seen by urology as consultation, and to follow-up as an outpatient in 12 days for possible stent removal.   2. Acute kidney injury with hyperkalemia. Patient was placed on IV fluids with remarkable improvement of kidney function. Discharge creatinine 1.14, with a serum bicarbonate of 22, chloride 106 potassium 3.8. No obstructive uropathy per renal ultrasonography. Close follow-up as an outpatient.  3. Type 2 diabetes mellitus. Patient was placed on insulin signed scale for glucose coverage and monitoring, his capillary glucose remained well-controlled. By the time of discharge he will resume metformin.  4. Hypertension. Antihypertensive agents were held due to severe sepsis, patient received IV fluids with good toleration his discharge blood pressure 133 systolic. He has been instructed to resume valsartan hydrochlorothiazide once blood pressure systolic 140. He will need a close follow-up as an outpatient.   Discharge Diagnoses:  Active Problems:   Severe sepsis [facility](HCC)    Discharge Instructions   Allergies as of 07/25/2016   No Known Allergies     Medication List    STOP taking these medications   ondansetron 8 MG disintegrating  tablet Commonly known as:  ZOFRAN-ODT   oxyCODONE-acetaminophen 5-325 MG tablet Commonly known as:  PERCOCET/ROXICET   ROCEPHIN 1 g injection Generic drug:  cefTRIAXone     TAKE these medications   amoxicillin-clavulanate 875-125 MG tablet Commonly known as:  AUGMENTIN Take 1 tablet by mouth every 12 (twelve) hours.   aspirin EC 81 MG tablet Take 81 mg by mouth daily.   atorvastatin 40 MG tablet Commonly known as:  LIPITOR Take 40 mg by mouth daily.   ezetimibe 10 MG tablet Commonly known as:  ZETIA Take 10 mg by mouth daily.   metFORMIN 500 MG 24 hr tablet Commonly known as:  GLUCOPHAGE-XR Take 500 mg by mouth daily.   valsartan-hydrochlorothiazide 160-12.5 MG tablet Commonly known as:  DIOVAN-HCT Take 1 tablet by mouth daily.       No Known Allergies  Consultations:  Critical care  Urology    Procedures/Studies: US Renal  Result Date: 07/22/2016 CLINICAL DATA:  Status post second look percutaneous left nephrolithotomy on 07/18/2016 for a stone measuring more than 2 cm. EXAM: RENAL / URINARY TRACT ULTRASOUND COMPLETE COMPARISON:  CT abdomen and pelvis 07/11/2016. FINDINGS: Right Kidney: Length: 12.4 cm. Echogenicity within normal limits. No mass or hydronephrosis visualized. Shadowing stones are identified measuring up to 1.2 cm. Left Kidney: Length: 12.1 cm.  Stent is in place.  No hydronephrosis.  No mass. Bladder: Appears normal for degree of bladder distention.  Stent is noted. IMPRESSION: Negative for hydronephrosis with a left double-J ureteral stent in place. No stones are seen on the left. Nonobstructing right renal stones. Electronically Signed   By: Drusilla Kanner M.D.   On: 07/22/2016 13:41   Dg Chest Port 1 View  Result Date: 07/22/2016 CLINICAL DATA:  66 year old diabetic hypertensive male with nausea and weakness. Possible sepsis. Initial encounter. Initial encounter. EXAM: PORTABLE CHEST 1 VIEW COMPARISON:  None. FINDINGS: Right costophrenic angle  not included on present exam. No infiltrate, congestive heart failure or pneumothorax. No plain film evidence of pulmonary malignancy. Patient would benefit from follow-up two-view chest cardiac leads removed when able. Heart size top-normal. IMPRESSION: No infiltrate or pulmonary edema. Heart size top-normal. Electronically Signed   By: Lacy Duverney M.D.   On: 07/22/2016 14:34   Dg C-arm 1-60 Min-no Report  Result Date: 07/18/2016 Fluoroscopy was utilized by the requesting physician.  No radiographic interpretation.   Dg C-arm 1-60 Min-no Report  Result Date: 07/08/2016 Fluoroscopy was utilized by the requesting physician.  No radiographic interpretation.     Subjective: Patient feeling better, no nausea or vomiting, no dyspnea or chest pain, no dysuria or increased frequency, no hematuria.   Discharge Exam: Vitals:   07/24/16 2017 07/25/16 0414  BP: 136/82 133/71  Pulse: 80 79  Resp: 18 16  Temp: 99.2 F (37.3 C) 98.4 F (36.9 C)   Vitals:   07/24/16 1353 07/24/16 2017 07/25/16 0414 07/25/16 0829  BP: 120/71 136/82 133/71   Pulse: 76 80 79   Resp: 18 18 16    Temp: 99.8 F (37.7 C) 99.2 F (37.3 C) 98.4 F (36.9 C)   TempSrc: Oral Oral Oral   SpO2: 97% 98% 98%   Weight:    99 kg (218 lb 4.1 oz)  Height:        General: Pt is alert, awake, not in acute distress E ENT, no pallor or icterus, oral mucosa moist.  Cardiovascular: RRR, S1/S2 +, no rubs, no gallops Respiratory: CTA bilaterally, no wheezing, no rhonchi Abdominal: Soft, NT,  ND, bowel sounds + Extremities: no edema, no cyanosis    The results of significant diagnostics from this hospitalization (including imaging, microbiology, ancillary and laboratory) are listed below for reference.     Microbiology: Recent Results (from the past 240 hour(s))  Culture, blood (routine x 2)     Status: None (Preliminary result)   Collection Time: 07/22/16 11:50 AM  Result Value Ref Range Status   Specimen Description  BLOOD LEFT ANTECUBITAL  Final   Special Requests   Final    BOTTLES DRAWN AEROBIC AND ANAEROBIC Blood Culture adequate volume   Culture  Setup Time   Final    GRAM POSITIVE COCCI IN CLUSTERS ANAEROBIC BOTTLE ONLY Organism ID to follow CRITICAL RESULT CALLED TO, READ BACK BY AND VERIFIED WITH: J. Teodoro Kil Pharm.D. 11:15 07/24/16 ([email or web address])    Culture GRAM POSITIVE COCCI  Final   Report Status PENDING  Incomplete  Blood Culture ID Panel (Reflexed)     Status: Abnormal   Collection Time: 07/22/16 11:50 AM  Result Value Ref Range Status   Enterococcus species NOT DETECTED NOT DETECTED Final   Listeria monocytogenes NOT DETECTED NOT DETECTED Final   Staphylococcus species DETECTED (A) NOT DETECTED Final    Comment: Methicillin (oxacillin) susceptible coagulase negative staphylococcus. Possible blood culture contaminant (unless isolated from more than one blood culture draw or clinical case suggests pathogenicity). No antibiotic treatment is indicated for blood  culture contaminants. CRITICAL RESULT CALLED TO, READ BACK BY AND VERIFIED WITH: J. Teodoro Kil Pharm.D. 11:15 07/24/16 ([email or web address])    Staphylococcus aureus NOT DETECTED NOT DETECTED Final   Methicillin resistance NOT DETECTED NOT DETECTED Final   Streptococcus species NOT DETECTED NOT DETECTED Final   Streptococcus agalactiae NOT DETECTED NOT DETECTED Final   Streptococcus pneumoniae NOT DETECTED NOT DETECTED Final   Streptococcus pyogenes NOT DETECTED NOT DETECTED Final   Acinetobacter baumannii NOT DETECTED NOT DETECTED Final   Enterobacteriaceae species NOT DETECTED NOT DETECTED Final   Enterobacter cloacae complex NOT DETECTED NOT DETECTED Final   Escherichia coli NOT DETECTED NOT DETECTED Final   Klebsiella oxytoca NOT DETECTED NOT DETECTED Final   Klebsiella pneumoniae NOT DETECTED NOT DETECTED Final   Proteus species NOT DETECTED NOT DETECTED Final   Serratia marcescens NOT DETECTED NOT DETECTED Final   Haemophilus influenzae NOT  DETECTED NOT DETECTED Final   Neisseria meningitidis NOT DETECTED NOT DETECTED Final   Pseudomonas aeruginosa NOT DETECTED NOT DETECTED Final   Candida albicans NOT DETECTED NOT DETECTED Final   Candida glabrata NOT DETECTED NOT DETECTED Final   Candida krusei NOT DETECTED NOT DETECTED Final   Candida parapsilosis NOT DETECTED NOT DETECTED Final   Candida tropicalis NOT DETECTED NOT DETECTED Final    Comment: Performed at [facility], [address] [place], [place] [address]  Urine culture     Status: Abnormal   Collection Time: 07/22/16 12:50 PM  Result Value Ref Range Status   Specimen Description URINE, CLEAN CATCH  Final   Special Requests NONE  Final   Culture >=100,000 COLONIES/mL ENTEROCOCCUS FAECALIS (A)  Final   Report Status 07/24/2016 FINAL  Final   Organism ID, Bacteria ENTEROCOCCUS FAECALIS (A)  Final      Susceptibility   Enterococcus faecalis - MIC*    AMPICILLIN <=2 SENSITIVE Sensitive     LEVOFLOXACIN 1 SENSITIVE Sensitive     NITROFURANTOIN <=16 SENSITIVE Sensitive     VANCOMYCIN 2 SENSITIVE Sensitive     * >=100,000 COLONIES/mL ENTEROCOCCUS FAECALIS  Culture, blood (  routine x 2)     Status: None (Preliminary result)   Collection Time: 07/22/16  1:41 PM  Result Value Ref Range Status   Specimen Description BLOOD LEFT HAND  Final   Special Requests   Final    BOTTLES DRAWN AEROBIC AND ANAEROBIC Blood Culture adequate volume   Culture   Final    NO GROWTH 3 DAYS Performed at [facility], [address], [place], [place] [address]    Report Status PENDING  Incomplete  MRSA PCR Screening     Status: None   Collection Time: 07/22/16  5:48 PM  Result Value Ref Range Status   MRSA by PCR NEGATIVE NEGATIVE Final    Comment:        The GeneXpert MRSA Assay (FDA approved for NASAL specimens only), is one component of a comprehensive MRSA colonization surveillance program. It is not intended to diagnose MRSA infection nor to guide or monitor  treatment for MRSA infections.      Labs: BNP (last 3 results) No results for input(s): BNP in the last 8760 hours. Basic Metabolic Panel:  Recent Labs Lab 07/22/16 1100 07/22/16 1912 07/23/16 0323 07/24/16 0554  NA 136  --  135 139  K 5.1  --  4.1 3.8  CL 99*  --  106 106  CO2 23  --  22 22  GLUCOSE 261*  --  157* 127*  BUN 34*  --  26* 21*  CREATININE 2.43* 1.94* 1.63* 1.14  CALCIUM 9.4  --  8.0* 8.4*   Liver Function Tests:  Recent Labs Lab 07/22/16 1100  AST 26  ALT 33  ALKPHOS 59  BILITOT 0.9  PROT 6.7  ALBUMIN 3.4*   No results for input(s): LIPASE, AMYLASE in the last 168 hours. No results for input(s): AMMONIA in the last 168 hours. CBC:  Recent Labs Lab 07/18/16 1149 07/22/16 1100 07/22/16 1912 07/23/16 0323 07/24/16 0554  WBC 9.4 35.5* 32.2* 24.1* 14.3*  NEUTROABS  --  33.7*  --   --  11.9*  HGB 14.7 13.1 12.1* 11.0* 10.4*  HCT 40.5 37.2* 35.4* 32.2* 29.6*  MCV 94.2 96.9 97.8 96.7 94.6  PLT 304 211 176 162 161   Cardiac Enzymes: No results for input(s): CKTOTAL, CKMB, CKMBINDEX, TROPONINI in the last 168 hours. BNP: Invalid input(s): POCBNP CBG:  Recent Labs Lab 07/24/16 0732 07/24/16 1214 07/24/16 1632 07/24/16 2114 07/25/16 0737  GLUCAP 118* 130* 120* 168* 113*   D-Dimer No results for input(s): DDIMER in the last 72 hours. Hgb A1c No results for input(s): HGBA1C in the last 72 hours. Lipid Profile No results for input(s): CHOL, HDL, LDLCALC, TRIG, CHOLHDL, LDLDIRECT in the last 72 hours. Thyroid function studies No results for input(s): TSH, T4TOTAL, T3FREE, THYROIDAB in the last 72 hours.  Invalid input(s): FREET3 Anemia work up No results for input(s): VITAMINB12, FOLATE, FERRITIN, TIBC, IRON, RETICCTPCT in the last 72 hours. Urinalysis    Component Value Date/Time   COLORURINE RED (A) 07/22/2016 1250   APPEARANCEUR TURBID (A) 07/22/2016 1250   LABSPEC  07/22/2016 1250    TEST NOT REPORTED DUE TO COLOR INTERFERENCE  OF URINE PIGMENT   PHURINE  07/22/2016 1250    TEST NOT REPORTED DUE TO COLOR INTERFERENCE OF URINE PIGMENT   GLUCOSEU (A) 07/22/2016 1250    TEST NOT REPORTED DUE TO COLOR INTERFERENCE OF URINE PIGMENT   HGBUR (A) 07/22/2016 1250    TEST NOT REPORTED DUE TO COLOR INTERFERENCE OF URINE  PIGMENT   BILIRUBINUR (A) 07/22/2016 1250    TEST NOT REPORTED DUE TO COLOR INTERFERENCE OF URINE PIGMENT   KETONESUR (A) 07/22/2016 1250    TEST NOT REPORTED DUE TO COLOR INTERFERENCE OF URINE PIGMENT   PROTEINUR (A) 07/22/2016 1250    TEST NOT REPORTED DUE TO COLOR INTERFERENCE OF URINE PIGMENT   NITRITE (A) 07/22/2016 1250    TEST NOT REPORTED DUE TO COLOR INTERFERENCE OF URINE PIGMENT   LEUKOCYTESUR (A) 07/22/2016 1250    TEST NOT REPORTED DUE TO COLOR INTERFERENCE OF URINE PIGMENT   Sepsis Labs Invalid input(s): PROCALCITONIN,  WBC,  LACTICIDVEN Microbiology Recent Results (from the past 240 hour(s))  Culture, blood (routine x 2)     Status: None (Preliminary result)   Collection Time: 07/22/16 11:50 AM  Result Value Ref Range Status   Specimen Description BLOOD LEFT ANTECUBITAL  Final   Special Requests   Final    BOTTLES DRAWN AEROBIC AND ANAEROBIC Blood Culture adequate volume   Culture  Setup Time   Final    GRAM POSITIVE COCCI IN CLUSTERS ANAEROBIC BOTTLE ONLY Organism ID to follow CRITICAL RESULT CALLED TO, READ BACK BY AND VERIFIED WITH: J. Teodoro Kil Pharm.D. 11:15 07/24/16 ([email or web address])    Culture GRAM POSITIVE COCCI  Final   Report Status PENDING  Incomplete  Blood Culture ID Panel (Reflexed)     Status: Abnormal   Collection Time: 07/22/16 11:50 AM  Result Value Ref Range Status   Enterococcus species NOT DETECTED NOT DETECTED Final   Listeria monocytogenes NOT DETECTED NOT DETECTED Final   Staphylococcus species DETECTED (A) NOT DETECTED Final    Comment: Methicillin (oxacillin) susceptible coagulase negative staphylococcus. Possible blood culture contaminant (unless isolated from more  than one blood culture draw or clinical case suggests pathogenicity). No antibiotic treatment is indicated for blood  culture contaminants. CRITICAL RESULT CALLED TO, READ BACK BY AND VERIFIED WITH: J. Teodoro Kil Pharm.D. 11:15 07/24/16 ([email or web address])    Staphylococcus aureus NOT DETECTED NOT DETECTED Final   Methicillin resistance NOT DETECTED NOT DETECTED Final   Streptococcus species NOT DETECTED NOT DETECTED Final   Streptococcus agalactiae NOT DETECTED NOT DETECTED Final   Streptococcus pneumoniae NOT DETECTED NOT DETECTED Final   Streptococcus pyogenes NOT DETECTED NOT DETECTED Final   Acinetobacter baumannii NOT DETECTED NOT DETECTED Final   Enterobacteriaceae species NOT DETECTED NOT DETECTED Final   Enterobacter cloacae complex NOT DETECTED NOT DETECTED Final   Escherichia coli NOT DETECTED NOT DETECTED Final   Klebsiella oxytoca NOT DETECTED NOT DETECTED Final   Klebsiella pneumoniae NOT DETECTED NOT DETECTED Final   Proteus species NOT DETECTED NOT DETECTED Final   Serratia marcescens NOT DETECTED NOT DETECTED Final   Haemophilus influenzae NOT DETECTED NOT DETECTED Final   Neisseria meningitidis NOT DETECTED NOT DETECTED Final   Pseudomonas aeruginosa NOT DETECTED NOT DETECTED Final   Candida albicans NOT DETECTED NOT DETECTED Final   Candida glabrata NOT DETECTED NOT DETECTED Final   Candida krusei NOT DETECTED NOT DETECTED Final   Candida parapsilosis NOT DETECTED NOT DETECTED Final   Candida tropicalis NOT DETECTED NOT DETECTED Final    Comment: Performed at [facility], [address], [place], [place] [address]  Urine culture     Status: Abnormal   Collection Time: 07/22/16 12:50 PM  Result Value Ref Range Status   Specimen Description URINE, CLEAN CATCH  Final   Special Requests NONE  Final   Culture >=100,000 COLONIES/mL ENTEROCOCCUS FAECALIS (A)  Final  Report Status 07/24/2016 FINAL  Final   Organism ID, Bacteria ENTEROCOCCUS FAECALIS (A)  Final       Susceptibility   Enterococcus faecalis - MIC*    AMPICILLIN <=2 SENSITIVE Sensitive     LEVOFLOXACIN 1 SENSITIVE Sensitive     NITROFURANTOIN <=16 SENSITIVE Sensitive     VANCOMYCIN 2 SENSITIVE Sensitive     * >=100,000 COLONIES/mL ENTEROCOCCUS FAECALIS  Culture, blood (routine x 2)     Status: None (Preliminary result)   Collection Time: 07/22/16  1:41 PM  Result Value Ref Range Status   Specimen Description BLOOD LEFT HAND  Final   Special Requests   Final    BOTTLES DRAWN AEROBIC AND ANAEROBIC Blood Culture adequate volume   Culture   Final    NO GROWTH 3 DAYS Performed at [facility], [address] [place], [place] [address]    Report Status PENDING  Incomplete  MRSA PCR Screening     Status: None   Collection Time: 07/22/16  5:48 PM  Result Value Ref Range Status   MRSA by PCR NEGATIVE NEGATIVE Final    Comment:        The GeneXpert MRSA Assay (FDA approved for NASAL specimens only), is one component of a comprehensive MRSA colonization surveillance program. It is not intended to diagnose MRSA infection nor to guide or monitor treatment for MRSA infections.      Time coordinating discharge: 45 minutes  SIGNED:   Coralie Keens, MD  [facility] 07/25/2016, 9:50 AM Pager   If 7PM-7AM, please contact night-coverage [email or web address] Password [email or web address]

## 2016-07-25 NOTE — Progress Notes (Signed)
Went over d/c instructions with patient.  He verbalized understanding. 

## 2016-07-25 NOTE — Progress Notes (Signed)
Date: July 25, 2016  Discharge orders review for case management needs.  None found  Per patient or family member no additional needs at home. Kem Parcher, BSN, RN3, CCM:  [phone] 

## 2016-07-27 LAB — CULTURE, BLOOD (ROUTINE X 2)
CULTURE: NO GROWTH
SPECIAL REQUESTS: ADEQUATE

## 2016-07-28 LAB — CULTURE, BLOOD (ROUTINE X 2): Special Requests: ADEQUATE

## 2016-07-29 LAB — CULTURE, BLOOD (ROUTINE X 2)
CULTURE: NO GROWTH
Culture: NO GROWTH
Special Requests: ADEQUATE
Special Requests: ADEQUATE

## 2016-08-14 ENCOUNTER — Inpatient Hospital Stay (HOSPITAL_COMMUNITY)
Admission: EM | Admit: 2016-08-14 | Discharge: 2016-08-20 | DRG: 872 | Disposition: A | Discharge status: Home Health | Payer: No Typology Code available for payment source | Attending: Internal Medicine | Admitting: Internal Medicine

## 2016-08-14 ENCOUNTER — Encounter (HOSPITAL_COMMUNITY): Payer: Self-pay

## 2016-08-14 DIAGNOSIS — N200 Calculus of kidney: Secondary | ICD-10-CM

## 2016-08-14 DIAGNOSIS — I350 Nonrheumatic aortic (valve) stenosis: Secondary | ICD-10-CM | POA: Diagnosis not present

## 2016-08-14 DIAGNOSIS — A419 Sepsis, unspecified organism: Secondary | ICD-10-CM | POA: Diagnosis not present

## 2016-08-14 DIAGNOSIS — Z9889 Other specified postprocedural states: Secondary | ICD-10-CM | POA: Diagnosis not present

## 2016-08-14 DIAGNOSIS — N179 Acute kidney failure, unspecified: Secondary | ICD-10-CM | POA: Diagnosis present

## 2016-08-14 DIAGNOSIS — M79672 Pain in left foot: Secondary | ICD-10-CM | POA: Diagnosis present

## 2016-08-14 DIAGNOSIS — Z7982 Long term (current) use of aspirin: Secondary | ICD-10-CM | POA: Diagnosis not present

## 2016-08-14 DIAGNOSIS — I358 Other nonrheumatic aortic valve disorders: Secondary | ICD-10-CM | POA: Diagnosis present

## 2016-08-14 DIAGNOSIS — Z79899 Other long term (current) drug therapy: Secondary | ICD-10-CM | POA: Diagnosis not present

## 2016-08-14 DIAGNOSIS — A4181 Sepsis due to Enterococcus: Secondary | ICD-10-CM | POA: Diagnosis present

## 2016-08-14 DIAGNOSIS — D72829 Elevated white blood cell count, unspecified: Secondary | ICD-10-CM | POA: Diagnosis not present

## 2016-08-14 DIAGNOSIS — E119 Type 2 diabetes mellitus without complications: Secondary | ICD-10-CM

## 2016-08-14 DIAGNOSIS — Z794 Long term (current) use of insulin: Secondary | ICD-10-CM | POA: Diagnosis not present

## 2016-08-14 DIAGNOSIS — E785 Hyperlipidemia, unspecified: Secondary | ICD-10-CM | POA: Diagnosis present

## 2016-08-14 DIAGNOSIS — N10 Acute tubulo-interstitial nephritis: Secondary | ICD-10-CM | POA: Diagnosis present

## 2016-08-14 DIAGNOSIS — M1990 Unspecified osteoarthritis, unspecified site: Secondary | ICD-10-CM | POA: Diagnosis present

## 2016-08-14 DIAGNOSIS — Z810 Family history of intellectual disabilities: Secondary | ICD-10-CM | POA: Diagnosis not present

## 2016-08-14 DIAGNOSIS — Z7984 Long term (current) use of oral hypoglycemic drugs: Secondary | ICD-10-CM

## 2016-08-14 DIAGNOSIS — Z87442 Personal history of urinary calculi: Secondary | ICD-10-CM | POA: Diagnosis not present

## 2016-08-14 DIAGNOSIS — R7881 Bacteremia: Secondary | ICD-10-CM

## 2016-08-14 DIAGNOSIS — B952 Enterococcus as the cause of diseases classified elsewhere: Secondary | ICD-10-CM

## 2016-08-14 DIAGNOSIS — D696 Thrombocytopenia, unspecified: Secondary | ICD-10-CM | POA: Diagnosis present

## 2016-08-14 DIAGNOSIS — R6520 Severe sepsis without septic shock: Secondary | ICD-10-CM | POA: Diagnosis present

## 2016-08-14 DIAGNOSIS — I10 Essential (primary) hypertension: Secondary | ICD-10-CM | POA: Diagnosis present

## 2016-08-14 DIAGNOSIS — N390 Urinary tract infection, site not specified: Secondary | ICD-10-CM | POA: Diagnosis not present

## 2016-08-14 DIAGNOSIS — R509 Fever, unspecified: Secondary | ICD-10-CM | POA: Diagnosis present

## 2016-08-14 LAB — CBC WITH DIFFERENTIAL/PLATELET
BASOS ABS: 0 10*3/uL (ref 0.0–0.1)
BASOS PCT: 0 %
EOS ABS: 0 10*3/uL (ref 0.0–0.7)
Eosinophils Relative: 0 %
HCT: 34.9 % — ABNORMAL LOW (ref 39.0–52.0)
HEMOGLOBIN: 12.3 g/dL — AB (ref 13.0–17.0)
LYMPHS PCT: 3 %
Lymphs Abs: 0.5 10*3/uL — ABNORMAL LOW (ref 0.7–4.0)
MCH: 33.5 pg (ref 26.0–34.0)
MCHC: 35.2 g/dL (ref 30.0–36.0)
MCV: 95.1 fL (ref 78.0–100.0)
Monocytes Absolute: 1 10*3/uL (ref 0.1–1.0)
Monocytes Relative: 6 %
NEUTROS ABS: 14.7 10*3/uL — AB (ref 1.7–7.7)
Neutrophils Relative %: 91 %
Platelets: 150 10*3/uL (ref 150–400)
RBC: 3.67 MIL/uL — AB (ref 4.22–5.81)
RDW: 13.5 % (ref 11.5–15.5)
WBC: 16.2 10*3/uL — AB (ref 4.0–10.5)

## 2016-08-14 LAB — URINALYSIS, ROUTINE W REFLEX MICROSCOPIC
BILIRUBIN URINE: NEGATIVE
GLUCOSE, UA: NEGATIVE mg/dL
KETONES UR: NEGATIVE mg/dL
Leukocytes, UA: NEGATIVE
NITRITE: POSITIVE — AB
PROTEIN: NEGATIVE mg/dL
Specific Gravity, Urine: 1.015 (ref 1.005–1.030)
Squamous Epithelial / LPF: NONE SEEN
pH: 5 (ref 5.0–8.0)

## 2016-08-14 LAB — COMPREHENSIVE METABOLIC PANEL
ALK PHOS: 49 U/L (ref 38–126)
ALT: 35 U/L (ref 17–63)
ANION GAP: 11 (ref 5–15)
AST: 28 U/L (ref 15–41)
Albumin: 3.7 g/dL (ref 3.5–5.0)
BILIRUBIN TOTAL: 1.1 mg/dL (ref 0.3–1.2)
BUN: 28 mg/dL — ABNORMAL HIGH (ref 6–20)
CALCIUM: 9.4 mg/dL (ref 8.9–10.3)
CO2: 21 mmol/L — ABNORMAL LOW (ref 22–32)
Chloride: 103 mmol/L (ref 101–111)
Creatinine, Ser: 1.67 mg/dL — ABNORMAL HIGH (ref 0.61–1.24)
GFR, EST AFRICAN AMERICAN: 49 mL/min — AB (ref >60)
GFR, EST NON AFRICAN AMERICAN: 42 mL/min — AB (ref >60)
Glucose, Bld: 164 mg/dL — ABNORMAL HIGH (ref 65–99)
POTASSIUM: 3.7 mmol/L (ref 3.5–5.1)
Sodium: 135 mmol/L (ref 135–145)
TOTAL PROTEIN: 6.7 g/dL (ref 6.5–8.1)

## 2016-08-14 LAB — I-STAT CG4 LACTIC ACID, ED: LACTIC ACID, VENOUS: 2.06 mmol/L — AB (ref 0.5–1.9)

## 2016-08-14 MED ORDER — INSULIN ASPART 100 UNIT/ML ~~LOC~~ SOLN
0.0000 [IU] | Freq: Every day | SUBCUTANEOUS | Status: DC
Start: 2016-08-14 — End: 2016-08-20

## 2016-08-14 MED ORDER — ASPIRIN EC 81 MG PO TBEC
81.0000 mg | DELAYED_RELEASE_TABLET | Freq: Every day | ORAL | Status: DC
  Administered 2016-08-15 – 2016-08-20 (×6): 81 mg via ORAL
  Filled 2016-08-14 (×6): qty 1

## 2016-08-14 MED ORDER — ATORVASTATIN CALCIUM 40 MG PO TABS
40.0000 mg | ORAL_TABLET | Freq: Every day | ORAL | Status: DC
  Administered 2016-08-15 – 2016-08-20 (×6): 40 mg via ORAL
  Filled 2016-08-14 (×6): qty 1

## 2016-08-14 MED ORDER — ACETAMINOPHEN 325 MG PO TABS
650.0000 mg | ORAL_TABLET | Freq: Four times a day (QID) | ORAL | Status: DC | PRN
  Administered 2016-08-15 (×2): 650 mg via ORAL
  Filled 2016-08-14 (×2): qty 2

## 2016-08-14 MED ORDER — OXYCODONE HCL 5 MG PO TABS
5.0000 mg | ORAL_TABLET | ORAL | Status: DC | PRN
Start: 2016-08-14 — End: 2016-08-20
  Administered 2016-08-14: 5 mg via ORAL
  Filled 2016-08-14: qty 1

## 2016-08-14 MED ORDER — SODIUM CHLORIDE 0.9 % IV SOLN
INTRAVENOUS | Status: DC
  Administered 2016-08-14 – 2016-08-18 (×4): via INTRAVENOUS

## 2016-08-14 MED ORDER — PIPERACILLIN-TAZOBACTAM 3.375 G IVPB
3.3750 g | Freq: Three times a day (TID) | INTRAVENOUS | Status: DC
  Administered 2016-08-15 (×2): 3.375 g via INTRAVENOUS
  Filled 2016-08-14 (×3): qty 50

## 2016-08-14 MED ORDER — ENOXAPARIN SODIUM 40 MG/0.4ML ~~LOC~~ SOLN
40.0000 mg | Freq: Every day | SUBCUTANEOUS | Status: DC
  Administered 2016-08-14 – 2016-08-19 (×5): 40 mg via SUBCUTANEOUS
  Filled 2016-08-14 (×5): qty 0.4

## 2016-08-14 MED ORDER — ACETAMINOPHEN 650 MG RE SUPP: 650.0000 mg | Freq: Four times a day (QID) | RECTAL | Status: DC | PRN

## 2016-08-14 MED ORDER — PIPERACILLIN-TAZOBACTAM 3.375 G IVPB 30 MIN
3.3750 g | INTRAVENOUS | Status: AC
  Administered 2016-08-14: 3.375 g via INTRAVENOUS
  Filled 2016-08-14: qty 50

## 2016-08-14 MED ORDER — ONDANSETRON HCL 4 MG PO TABS: 4.0000 mg | ORAL_TABLET | Freq: Four times a day (QID) | ORAL | Status: DC | PRN

## 2016-08-14 MED ORDER — ONDANSETRON HCL 4 MG/2ML IJ SOLN
4.0000 mg | Freq: Four times a day (QID) | INTRAMUSCULAR | Status: DC | PRN
  Administered 2016-08-14: 4 mg via INTRAVENOUS
  Filled 2016-08-14: qty 2

## 2016-08-14 MED ORDER — SODIUM CHLORIDE 0.9 % IV BOLUS (SEPSIS)
1000.0000 mL | Freq: Once | INTRAVENOUS | Status: AC
  Administered 2016-08-14: 1000 mL via INTRAVENOUS

## 2016-08-14 MED ORDER — PHENAZOPYRIDINE HCL 100 MG PO TABS
100.0000 mg | ORAL_TABLET | Freq: Three times a day (TID) | ORAL | Status: DC
  Administered 2016-08-15 – 2016-08-18 (×10): 100 mg via ORAL
  Filled 2016-08-14 (×11): qty 1

## 2016-08-14 MED ORDER — INSULIN ASPART 100 UNIT/ML ~~LOC~~ SOLN
0.0000 [IU] | Freq: Three times a day (TID) | SUBCUTANEOUS | Status: DC
  Administered 2016-08-15 (×3): 1 [IU] via SUBCUTANEOUS
  Administered 2016-08-16 (×3): 2 [IU] via SUBCUTANEOUS
  Administered 2016-08-17 – 2016-08-18 (×3): 1 [IU] via SUBCUTANEOUS
  Administered 2016-08-18: 2 [IU] via SUBCUTANEOUS
  Administered 2016-08-19: 1 [IU] via SUBCUTANEOUS
  Administered 2016-08-19: 2 [IU] via SUBCUTANEOUS

## 2016-08-14 MED ORDER — EZETIMIBE 10 MG PO TABS
10.0000 mg | ORAL_TABLET | Freq: Every day | ORAL | Status: DC
  Administered 2016-08-15 – 2016-08-20 (×6): 10 mg via ORAL
  Filled 2016-08-14 (×6): qty 1

## 2016-08-14 MED ORDER — DEXTROSE 5 % IV SOLN
2.0000 g | Freq: Once | INTRAVENOUS | Status: DC
  Filled 2016-08-14: qty 2

## 2016-08-14 NOTE — ED Provider Notes (Signed)
[facility]-EMERGENCY DEPT Provider Note   CSN: [id number] Arrival date & time: 08/14/16  1930     History   Chief Complaint Chief Complaint  Patient presents with  . Fever  . Urinary Retention    HPI Keith Ortiz is a 66 y.o. male who presents to the ED with cc Fever and urinary tract infection. He has an extensive urologic history including recent lithotripsy, severe sepsis with urinary tract infection involving E faecalis. The patient states that he had his stent removed this past Thursday. He has been doing well up until yesterday when he developed symptoms of UTI including burning pain and urgency along with suprapubic tenderness. Today, [facility] at [facility] who prescribed Macrobid.he took one dose, later he developed a fever and then came to the emergency department. He states that he has had some chills and had a fever of 101 earlier today. He states that he came to the ER to afford becoming septic again.  HPI  Past Medical History:  Diagnosis Date  . Arthritis   . Diabetes mellitus without complication (HCC)    type 2  . History of kidney stones   . HLD (hyperlipidemia)   . Hypertension   . Pneumonia     Patient Active Problem List   Diagnosis Date Noted  . Severe sepsis (HCC) 07/22/2016  . Renal calculus, left 07/08/2016    Past Surgical History:  Procedure Laterality Date  . BLADDER STONE REMOVAL    . CYSTOSCOPY WITH STENT PLACEMENT Left 07/08/2016   Procedure: CYSTOSCOPY WITH STENT PLACEMENT;  Surgeon: Malen GauzeMcKenzie, Patrick L, MD;  Location: [facility] ORS;  Service: Urology;  Laterality: Left;  . FRACTURE SURGERY    . HERNIA REPAIR    . LITHOTRIPSY    . NEPHROLITHOTOMY Left 07/08/2016   Procedure: NEPHROLITHOTOMY PERCUTANEOUS;  Surgeon: Malen GauzeMcKenzie, Patrick L, MD;  Location: [facility] ORS;  Service: Urology;  Laterality: Left;  . NEPHROLITHOTOMY Left 07/18/2016   Procedure: NEPHROLITHOTOMY PERCUTANEOUS SECOND LOOK;  Surgeon: Malen GauzeMcKenzie, Patrick L, MD;  Location: [facility] ORS;  Service:  Urology;  Laterality: Left;  . TONSILLECTOMY         Home Medications    Prior to Admission medications   Medication Sig Start Date End Date Taking? Authorizing Provider  aspirin EC 81 MG tablet Take 81 mg by mouth daily.    [provider]  atorvastatin (LIPITOR) 40 MG tablet Take 40 mg by mouth daily. 06/05/16   [provider]  ezetimibe (ZETIA) 10 MG tablet Take 10 mg by mouth daily. 06/05/16   [provider]  metFORMIN (GLUCOPHAGE-XR) 500 MG 24 hr tablet Take 500 mg by mouth daily. 06/11/16   [provider]  valsartan-hydrochlorothiazide (DIOVAN-HCT) 160-12.5 MG tablet Take 1 tablet by mouth daily. 06/05/16   [provider]    Family History History reviewed. No pertinent family history.  Social History Social History  Substance Use Topics  . Smoking status: Never Smoker  . Smokeless tobacco: Never Used  . Alcohol use Yes     Comment: occasionally     Allergies   Patient has no known allergies.   Review of Systems Review of Systems  Ten systems reviewed and are negative for acute change, except as noted in the HPI.   Physical Exam Updated Vital Signs BP 111/85 (BP Location: Left Arm)   Pulse (!) 110   Temp 100 F (37.8 C) (Oral)   Resp 18   SpO2 96%   Physical Exam  Constitutional:  He appears well-developed and well-nourished. No distress.  HENT:  Head: Normocephalic and atraumatic.  Eyes: Conjunctivae are normal. No scleral icterus.  Neck: Normal range of motion. Neck supple.  Cardiovascular: Normal rate, regular rhythm and normal heart sounds.   Pulmonary/Chest: Effort normal and breath sounds normal. No respiratory distress.  Abdominal: Soft. There is tenderness.  Suprapubic pain, no CVA tenderness  Musculoskeletal: He exhibits no edema.  Neurological: He is alert.  Skin: Skin is warm and dry. He is not diaphoretic.  Psychiatric: His behavior is normal.  Nursing note and vitals reviewed.    ED  Treatments / Results  Labs (all labs ordered are listed, but only abnormal results are displayed) Labs Reviewed - No data to display  EKG  EKG Interpretation None       Radiology No results found.  Procedures Procedures (including critical care time)  Medications Ordered in ED Medications - No data to display   Initial Impression / Assessment and Plan / ED Course  I have reviewed the triage vital signs and the nursing notes.  Pertinent labs & imaging results that were available during my care of the patient were reviewed by me and considered in my medical decision making (see chart for details).     Patient with recent E.feacalis urosepsis infection.  Here again with urinary sxs, fever, elevated lactic acid.  I have ordered abx, fluids. The patient will be admitted by the hospitalist.  Fever resolved in tthe ED.   Final Clinical Impressions(s) / ED Diagnoses   Final diagnoses:  Sepsis, due to unspecified organism [facility](HCC)    New Prescriptions New Prescriptions   No medications on file     Arthor CaptainHarris, Jamai Dolce, PA-C 08/16/16 0758    Jacalyn LefevreHaviland, Julie, MD 08/19/16 [phone]

## 2016-08-14 NOTE — ED Triage Notes (Signed)
Pt complains of a fever and urinary pain Pt was recently hospitalized for sepsis ans last week had a stent removed after having kidney stones

## 2016-08-14 NOTE — Progress Notes (Addendum)
Pharmacy Antibiotic Note  Keith RainwaterJames S Bradly is a 66 y.o. male admitted on 08/14/2016 with UTI.  Pharmacy has been consulted for  Dosing. [facility].  UCx grew amp-susceptible E. Faecalis.  He was to complete 14-days abx on 7/24 (sent home on amoxicllin/clav). He had the ureteral stent removed last week. Cefepime ordered in ED but does not cover enterococcus.  Suggest zosyn for nosocomial GNR coverage and enterococcal coverage.   Plan:  Zosyn 3.375gm IV q8h over 4h infusion  Follow-up renal function  Temp (24hrs), Avg:100 F (37.8 C), Min:100 F (37.8 C), Max:100 F (37.8 C)  No results for input(s): WBC, CREATININE, LATICACIDVEN, VANCOTROUGH, VANCOPEAK, VANCORANDOM, GENTTROUGH, GENTPEAK, GENTRANDOM, TOBRATROUGH, TOBRAPEAK, TOBRARND, AMIKACINPEAK, AMIKACINTROU, AMIKACIN in the last 168 hours.  CrCl cannot be calculated (Patient's most recent lab result is older than the maximum 21 days allowed.).    No Known Allergies  Antimicrobials this admission: 8/1 zosyn >>  Dose adjustments this admission:  Microbiology results: 7/9 Ucx: pan-susc E. faecalis 8/1 Bcx 8/1 Ucx  Thank you for allowing pharmacy to be a part of this patient's care.  Juliette Alcideustin Maliik Karner, PharmD, BCPS.   Pager: [phone] 08/14/2016 9:00 PM

## 2016-08-14 NOTE — H&P (Signed)
History and Physical  Patient Name: Keith Ortiz     WJX:[id number]    DOB: 1950-12-13    DOA: 08/14/2016 PCP: Iona Hansen, NP  Patient coming from: Home  Chief Complaint: Dysuria, fever      HPI: Keith Ortiz is a 66 y.o. male with a past medical history significant for NIDDM, HTN and recent large stone s/p enterococcal sepsis this month who presents with dysuria and fever for 1 day.  The patient was recently diagnosed with a 1-2 cm left nephrolith, underwent lithotripsy by Dr. Thea Silversmith this summer, then got a repeat procedure with stent placement on 7/5.  He was admitted from 7/9-7/12 with sepsis and AKI (peak Cr 2.4).  Cultures grew enterococcus and he was discharged on Augmentin.  He completed the course of Augmentin, and last Thursday had the stent removed.   Since stent removal he has been fine, feeling well, no complaints until last night he developed dysuria and suprapubic pain. Then today the dysuria persisted, in the afternoon he developed fever to 101F, nausea, and so he came to the emergency room. He has had no flank pain, no vomiting, no dizziness, no confusion, no syncope. He's had no cough, sputum production.  ED course: -Temp 100 F, heart rate 110, respirations and pulse ox normal, blood pressure 111/85 -Na 135, K 3.7, Cr 1.67 (baseline 1.1-1.2), WBC 16.2K, Hgb 12.3 -Urinalysis with nitrites, WBC TNTC -Lactate 2.06 -Blood cultures were obtained, Zosyn was administered and TRH were asked to evaluate for admission         ROS: Review of Systems  Constitutional: Positive for fever and malaise/fatigue.  Gastrointestinal: Positive for nausea. Negative for abdominal pain and vomiting.  Genitourinary: Positive for dysuria. Negative for flank pain and hematuria.  All other systems reviewed and are negative.         Past Medical History:  Diagnosis Date  . Arthritis   . Diabetes mellitus without complication (HCC)    type 2  . History of kidney stones   .  HLD (hyperlipidemia)   . Hypertension   . Pneumonia     Past Surgical History:  Procedure Laterality Date  . BLADDER STONE REMOVAL    . CYSTOSCOPY WITH STENT PLACEMENT Left 07/08/2016   Procedure: CYSTOSCOPY WITH STENT PLACEMENT;  Surgeon: Malen Gauze, MD;  Location: [facility];  Service: Urology;  Laterality: Left;  . FRACTURE SURGERY    . HERNIA REPAIR    . LITHOTRIPSY    . NEPHROLITHOTOMY Left 07/08/2016   Procedure: NEPHROLITHOTOMY PERCUTANEOUS;  Surgeon: Malen Gauze, MD;  Location: [facility];  Service: Urology;  Laterality: Left;  . NEPHROLITHOTOMY Left 07/18/2016   Procedure: NEPHROLITHOTOMY PERCUTANEOUS SECOND LOOK;  Surgeon: Malen Gauze, MD;  Location: [facility];  Service: Urology;  Laterality: Left;  . TONSILLECTOMY      Social History: Patient lives with his wife.  He is a Technical sales engineer.  He is from [place].  The patient walks unassisted.  Nonsmoker.  Minimal alcohol.  No Known Allergies  Family history: family history includes Dementia in his mother; Hemochromatosis in his brother.  Prior to Admission medications   Medication Sig Start Date End Date Taking? Authorizing Provider  acetaminophen (TYLENOL) 500 MG tablet Take 1,000 mg by mouth every 6 (six) hours as needed for mild pain.   Yes [provider]  aspirin EC 81 MG tablet Take 81 mg by mouth daily.   Yes [provider]  atorvastatin (LIPITOR) 40 MG tablet Take  40 mg by mouth daily. 06/05/16  Yes [provider]  ezetimibe (ZETIA) 10 MG tablet Take 10 mg by mouth daily. 06/05/16  Yes [provider]  metFORMIN (GLUCOPHAGE-XR) 500 MG 24 hr tablet Take 500 mg by mouth daily. 06/11/16  Yes [provider]  nitrofurantoin, macrocrystal-monohydrate, (MACROBID) 100 MG capsule Take 100 mg by mouth 2 (two) times daily.   Yes [provider]  phenazopyridine (PYRIDIUM) 97 MG tablet Take 97 mg by mouth 2 (two) times daily as needed for pain.   Yes [provider]  valsartan-hydrochlorothiazide (DIOVAN-HCT) 160-12.5 MG tablet Take 1 tablet by mouth daily. 06/05/16  Yes [provider]       Physical Exam: BP 111/85 (BP Location: Left Arm)   Pulse (!) 110   Temp 100 F (37.8 C) (Oral)   Resp 18   SpO2 96%  General appearance: Well-developed, adult male, alert and in no acute distress.   Eyes: Anicteric, conjunctiva pink, lids and lashes normal. PERRL.    ENT: No nasal deformity, discharge, epistaxis.  Hearing normal. OP moist without lesions.   Neck: No neck masses.  Trachea midline.  No thyromegaly/tenderness. Lymph: No cervical or supraclavicular lymphadenopathy. Skin: Warm and dry.  No jaundice.  No suspicious rashes or lesions. Cardiac: Mild tachycardia, regular rhythm, nl S1-S2, no murmurs appreciated.  Capillary refill is brisk.  JVP not visible.  No LE edema.  Radial and DP pulses 2+ and symmetric. Respiratory: Normal respiratory rate and rhythm.  CTAB without rales or wheezes. Abdomen: Abdomen soft.  Mild suprapubic TTP. No ascites, distension, hepatosplenomegaly.   MSK: No deformities or effusions.  No cyanosis or clubbing. Neuro: Cranial nerves normal.  Sensation intact to light touch. Speech is fluent.  Muscle strength 5/5 and eqyal.    Psych: Sensorium intact and responding to questions, attention normal.  Behavior appropriate.  Affect normal.  Judgment and insight appear normal.     Labs on Admission:  I have personally reviewed following labs and imaging studies: CBC:  Recent Labs Lab 08/14/16 2028  WBC 16.2*  NEUTROABS 14.7*  HGB 12.3*  HCT 34.9*  MCV 95.1  PLT 150   Basic Metabolic Panel:  Recent Labs Lab 08/14/16 2028  NA 135  K 3.7  CL 103  CO2 21*  GLUCOSE 164*  BUN 28*  CREATININE 1.67*  CALCIUM 9.4   GFR: CrCl cannot be calculated (Unknown ideal weight.).  Liver Function Tests:  Recent Labs Lab 08/14/16 2028  AST 28  ALT 35  ALKPHOS 49  BILITOT 1.1  PROT 6.7  ALBUMIN 3.7    Sepsis Labs: Lactate 2.06        EKG: Independently reviewed. Rate 103, QTc normal.  No ST changes.        Assessment/Plan  1. Sepsis:  Suspected source urinary. Organism unknown, nitrite positive now.   Patient meets criteria given tachycardia, fever, leukocytosis, and evidence of organ dysfunction.  Lactate 2.06 mmol/L and repeat ordered within 6 hours.  This patient is not at high risk of poor outcomes with a qSOFA score of 0.  Antibiotics delivered in the ED.    -Sepsis bundle utilized:  -Blood and urine cultures drawn  -Fluids bolus given in ED, will repeat lactic acid  -Antibiotics: Zosyn given recent isntrumentation, antibiotics  -Repeat renal function and complete blood count in AM  -Code SEPSIS called to E-link  -Patient appears benign at present, no flank pain, will defer Urology consultation until AM  2. Acute kidney injury:  From sepsis/pyelo.  Baseline Cr 1.1, now 1.7. -IV fluids -Hold nephrotoxins  3. Diabetes:  Normoglycemic at admission -Hold metformin -Low dose SSI  4. Hypertension:  -Hold valsartan-HCTZ until hemodynamics clearer -Continue aspirin, statin, Zetia           DVT prophylaxis: Lovenox  Code Status: FULL  Family Communication: Wife at bedside  Disposition Plan: Anticipate IV fluids, empiric antibiotics, follow culture data Consults called: None overnight Admission status: INPATIENT         Medical decision making: Patient seen at 9:50 PM on 08/14/2016.  The patient was discussed with Huntley DecAbi Harris, PA-C.  What exists of the patient's chart was reviewed in depth and summarized above.  Clinical condition: stable hemodynamically and mentation good.        Alberteen SamChristopher P Darry Kelnhofer [facility] Pager [phone]       At the time of admission, it appears that the appropriate admission status for this patient is INPATIENT. This is judged to be reasonable and necessary in order to provide the required  intensity of service to ensure the patient's safety given the presenting symptoms, physical exam findings, and initial radiographic and laboratory data in the context of their chronic comorbidities.  Together, these circumstances are felt to place him at high risk for further clinical deterioration threatening life, limb, or organ.   Patient requires inpatient status due to high intensity of service, high risk for further deterioration and high frequency of surveillance required because of this acute illness that poses a threat to life, limb or bodily function.  Factors to support inpatient status include tachycardia at 110, acute kidney injury, some evidence of end organ injury with lactate greater than 2, and recent hospitalization for severe sepsis from UTI, now with symptoms of recurrent UTI.  I certify that at the point of admission it is my clinical judgment that the patient will require inpatient hospital care spanning beyond 2 midnights from the point of admission and that early discharge would result in unnecessary risk of decompensation and readmission or threat to life, limb or bodily function.

## 2016-08-15 ENCOUNTER — Inpatient Hospital Stay (HOSPITAL_COMMUNITY): Payer: No Typology Code available for payment source

## 2016-08-15 DIAGNOSIS — A419 Sepsis, unspecified organism: Secondary | ICD-10-CM

## 2016-08-15 DIAGNOSIS — N390 Urinary tract infection, site not specified: Secondary | ICD-10-CM

## 2016-08-15 DIAGNOSIS — I10 Essential (primary) hypertension: Secondary | ICD-10-CM

## 2016-08-15 DIAGNOSIS — N179 Acute kidney failure, unspecified: Secondary | ICD-10-CM

## 2016-08-15 DIAGNOSIS — D72829 Elevated white blood cell count, unspecified: Secondary | ICD-10-CM

## 2016-08-15 LAB — BASIC METABOLIC PANEL
Anion gap: 9 (ref 5–15)
BUN: 24 mg/dL — AB (ref 6–20)
CALCIUM: 8.5 mg/dL — AB (ref 8.9–10.3)
CHLORIDE: 108 mmol/L (ref 101–111)
CO2: 20 mmol/L — AB (ref 22–32)
CREATININE: 1.47 mg/dL — AB (ref 0.61–1.24)
GFR calc non Af Amer: 49 mL/min — ABNORMAL LOW (ref >60)
GFR, EST AFRICAN AMERICAN: 57 mL/min — AB (ref >60)
GLUCOSE: 143 mg/dL — AB (ref 65–99)
Potassium: 4 mmol/L (ref 3.5–5.1)
Sodium: 137 mmol/L (ref 135–145)

## 2016-08-15 LAB — CBC
HCT: 32.7 % — ABNORMAL LOW (ref 39.0–52.0)
Hemoglobin: 11.6 g/dL — ABNORMAL LOW (ref 13.0–17.0)
MCH: 33.9 pg (ref 26.0–34.0)
MCHC: 35.5 g/dL (ref 30.0–36.0)
MCV: 95.6 fL (ref 78.0–100.0)
PLATELETS: 139 10*3/uL — AB (ref 150–400)
RBC: 3.42 MIL/uL — AB (ref 4.22–5.81)
RDW: 13.6 % (ref 11.5–15.5)
WBC: 17.6 10*3/uL — ABNORMAL HIGH (ref 4.0–10.5)

## 2016-08-15 LAB — BLOOD CULTURE ID PANEL (REFLEXED)
ACINETOBACTER BAUMANNII: NOT DETECTED
CANDIDA ALBICANS: NOT DETECTED
CANDIDA GLABRATA: NOT DETECTED
CANDIDA KRUSEI: NOT DETECTED
CANDIDA PARAPSILOSIS: NOT DETECTED
Candida tropicalis: NOT DETECTED
ENTEROBACTER CLOACAE COMPLEX: NOT DETECTED
ESCHERICHIA COLI: NOT DETECTED
Enterobacteriaceae species: NOT DETECTED
Enterococcus species: DETECTED — AB
Haemophilus influenzae: NOT DETECTED
KLEBSIELLA OXYTOCA: NOT DETECTED
Klebsiella pneumoniae: NOT DETECTED
Listeria monocytogenes: NOT DETECTED
Neisseria meningitidis: NOT DETECTED
PSEUDOMONAS AERUGINOSA: NOT DETECTED
Proteus species: NOT DETECTED
SERRATIA MARCESCENS: NOT DETECTED
STAPHYLOCOCCUS SPECIES: NOT DETECTED
STREPTOCOCCUS PNEUMONIAE: NOT DETECTED
STREPTOCOCCUS PYOGENES: NOT DETECTED
Staphylococcus aureus (BCID): NOT DETECTED
Streptococcus agalactiae: NOT DETECTED
Streptococcus species: NOT DETECTED
Vancomycin resistance: NOT DETECTED

## 2016-08-15 LAB — GLUCOSE, CAPILLARY
GLUCOSE-CAPILLARY: 127 mg/dL — AB (ref 65–99)
GLUCOSE-CAPILLARY: 131 mg/dL — AB (ref 65–99)
GLUCOSE-CAPILLARY: 142 mg/dL — AB (ref 65–99)
Glucose-Capillary: 138 mg/dL — ABNORMAL HIGH (ref 65–99)
Glucose-Capillary: 123 mg/dL — ABNORMAL HIGH (ref 65–99)

## 2016-08-15 LAB — LACTIC ACID, PLASMA: Lactic Acid, Venous: 1.8 mmol/L (ref 0.5–1.9)

## 2016-08-15 MED ORDER — IOPAMIDOL (ISOVUE-300) INJECTION 61%
30.0000 mL | Freq: Once | INTRAVENOUS | Status: AC | PRN
Start: 2016-08-15 — End: 2016-08-15
  Administered 2016-08-15: 30 mL via ORAL

## 2016-08-15 MED ORDER — IOPAMIDOL (ISOVUE-300) INJECTION 61%
INTRAVENOUS | Status: AC
  Administered 2016-08-15: 30 mL via ORAL
  Filled 2016-08-15: qty 30

## 2016-08-15 MED ORDER — SODIUM CHLORIDE 0.9 % IV SOLN
2.0000 g | Freq: Four times a day (QID) | INTRAVENOUS | Status: DC
  Administered 2016-08-15 – 2016-08-20 (×21): 2 g via INTRAVENOUS
  Filled 2016-08-15 (×23): qty 2000

## 2016-08-15 NOTE — Progress Notes (Signed)
PHARMACY - PHYSICIAN COMMUNICATION CRITICAL VALUE ALERT - BLOOD CULTURE IDENTIFICATION (BCID)  Results for orders placed or performed during the hospital encounter of 08/14/16  Blood Culture ID Panel (Reflexed) (Collected: 08/14/2016  9:04 PM)  Result Value Ref Range   Enterococcus species DETECTED (A) NOT DETECTED   Vancomycin resistance NOT DETECTED NOT DETECTED   Listeria monocytogenes NOT DETECTED NOT DETECTED   Staphylococcus species NOT DETECTED NOT DETECTED   Staphylococcus aureus NOT DETECTED NOT DETECTED   Streptococcus species NOT DETECTED NOT DETECTED   Streptococcus agalactiae NOT DETECTED NOT DETECTED   Streptococcus pneumoniae NOT DETECTED NOT DETECTED   Streptococcus pyogenes NOT DETECTED NOT DETECTED   Acinetobacter baumannii NOT DETECTED NOT DETECTED   Enterobacteriaceae species NOT DETECTED NOT DETECTED   Enterobacter cloacae complex NOT DETECTED NOT DETECTED   Escherichia coli NOT DETECTED NOT DETECTED   Klebsiella oxytoca NOT DETECTED NOT DETECTED   Klebsiella pneumoniae NOT DETECTED NOT DETECTED   Proteus species NOT DETECTED NOT DETECTED   Serratia marcescens NOT DETECTED NOT DETECTED   Haemophilus influenzae NOT DETECTED NOT DETECTED   Neisseria meningitidis NOT DETECTED NOT DETECTED   Pseudomonas aeruginosa NOT DETECTED NOT DETECTED   Candida albicans NOT DETECTED NOT DETECTED   Candida glabrata NOT DETECTED NOT DETECTED   Candida krusei NOT DETECTED NOT DETECTED   Candida parapsilosis NOT DETECTED NOT DETECTED   Candida tropicalis NOT DETECTED NOT DETECTED    Name of physician (or Provider) ContactedHanley Ben: Alekh  Changes to prescribed antibiotics required: none - continue Zosyn  Berkley HarveyLegge, Jaylani Mcguinn Marshall 08/15/2016  3:56 PM

## 2016-08-15 NOTE — Consult Note (Signed)
Reason for Consult: Urosepsis, Nephrolithiasis  Referring Physician:   [name] is an 66 y.o. male.   HPI: K. Alekh MD  1 - Urosepsis - fevers, tachycardia, nitrite positive urine c/w likely urosepsis by ER Eval 08/15/16. Most recent CX data pan-sensitive enterococcus from 07/2016. Placed on empiric Zosyn. Cr 1.47. New BCX, [address] 08/15/16 pending. CT w/o obstructing stones.   2 - Nephrolithiasis - s/p two stage percutanteous stone srugery 07/2016 by Dr. Noah Delaine for large left renal / ureteral stone with stent pull in office 08/08/16.  Fu CT 08/15/16 left side stone free, just small non-obstructing Rt renal and small bladder stones.   Today "Keith Ortiz" is seen in consultation for above.   Past Medical History:  Diagnosis Date  . Arthritis   . Diabetes mellitus without complication ([place])    type 2  . History of kidney stones   . HLD (hyperlipidemia)   . Hypertension   . Pneumonia     Past Surgical History:  Procedure Laterality Date  . BLADDER STONE REMOVAL    . CYSTOSCOPY WITH STENT PLACEMENT Left 07/08/2016   Procedure: CYSTOSCOPY WITH STENT PLACEMENT;  Surgeon: Cleon Gustin, MD;  Location: [facility];  Service: Urology;  Laterality: Left;  . FRACTURE SURGERY    . HERNIA REPAIR    . LITHOTRIPSY    . NEPHROLITHOTOMY Left 07/08/2016   Procedure: NEPHROLITHOTOMY PERCUTANEOUS;  Surgeon: Cleon Gustin, MD;  Location: [facility];  Service: Urology;  Laterality: Left;  . NEPHROLITHOTOMY Left 07/18/2016   Procedure: NEPHROLITHOTOMY PERCUTANEOUS SECOND LOOK;  Surgeon: Cleon Gustin, MD;  Location: [facility];  Service: Urology;  Laterality: Left;  . TONSILLECTOMY      Family History  Problem Relation Age of Onset  . Dementia Mother   . Hemochromatosis Brother     Social History:  reports that he has never smoked. He has never used smokeless tobacco. He reports that he drinks alcohol. He reports that he does not use drugs.  Allergies: No Known Allergies  Medications: I have  reviewed the patient's current medications.  Results for orders placed or performed during the hospital encounter of 08/14/16 (from the past 48 hour(s))  Urinalysis, Routine w reflex microscopic     Status: Abnormal   Collection Time: 08/14/16  8:10 PM  Result Value Ref Range   Color, Urine AMBER (A) YELLOW    Comment: BIOCHEMICALS MAY BE AFFECTED BY COLOR   APPearance CLEAR CLEAR   Specific Gravity, Urine 1.015 1.005 - 1.030   pH 5.0 5.0 - 8.0   Glucose, UA NEGATIVE NEGATIVE mg/dL   Hgb urine dipstick SMALL (A) NEGATIVE   Bilirubin Urine NEGATIVE NEGATIVE   Ketones, ur NEGATIVE NEGATIVE mg/dL   Protein, ur NEGATIVE NEGATIVE mg/dL   Nitrite POSITIVE (A) NEGATIVE   Leukocytes, UA NEGATIVE NEGATIVE   RBC / HPF 0-5 0 - 5 RBC/hpf   WBC, UA TOO NUMEROUS TO COUNT 0 - 5 WBC/hpf   Bacteria, UA RARE (A) NONE SEEN   Squamous Epithelial / LPF NONE SEEN NONE SEEN   Mucous PRESENT   Comprehensive metabolic panel     Status: Abnormal   Collection Time: 08/14/16  8:28 PM  Result Value Ref Range   Sodium 135 135 - 145 mmol/L   Potassium 3.7 3.5 - 5.1 mmol/L   Chloride 103 101 - 111 mmol/L   CO2 21 (L) 22 - 32 mmol/L   Glucose, Bld 164 (H) 65 - 99 mg/dL   BUN 28 (H)  6 - 20 mg/dL   Creatinine, Ser 1.67 (H) 0.61 - 1.24 mg/dL   Calcium 9.4 8.9 - 10.3 mg/dL   Total Protein 6.7 6.5 - 8.1 g/dL   Albumin 3.7 3.5 - 5.0 g/dL   AST 28 15 - 41 U/L   ALT 35 17 - 63 U/L   Alkaline Phosphatase 49 38 - 126 U/L   Total Bilirubin 1.1 0.3 - 1.2 mg/dL   GFR calc non Af Amer 42 (L) >60 mL/min   GFR calc Af Amer 49 (L) >60 mL/min    Comment: (NOTE) The eGFR has been calculated using the CKD EPI equation. This calculation has not been validated in all clinical situations. eGFR's persistently <60 mL/min signify possible Chronic Kidney Disease.    Anion gap 11 5 - 15  CBC WITH DIFFERENTIAL     Status: Abnormal   Collection Time: 08/14/16  8:28 PM  Result Value Ref Range   WBC 16.2 (H) 4.0 - 10.5 K/uL    RBC 3.67 (L) 4.22 - 5.81 MIL/uL   Hemoglobin 12.3 (L) 13.0 - 17.0 g/dL   HCT 34.9 (L) 39.0 - 52.0 %   MCV 95.1 78.0 - 100.0 fL   MCH 33.5 26.0 - 34.0 pg   MCHC 35.2 30.0 - 36.0 g/dL   RDW 13.5 11.5 - 15.5 %   Platelets 150 150 - 400 K/uL   Neutrophils Relative % 91 %   Neutro Abs 14.7 (H) 1.7 - 7.7 K/uL   Lymphocytes Relative 3 %   Lymphs Abs 0.5 (L) 0.7 - 4.0 K/uL   Monocytes Relative 6 %   Monocytes Absolute 1.0 0.1 - 1.0 K/uL   Eosinophils Relative 0 %   Eosinophils Absolute 0.0 0.0 - 0.7 K/uL   Basophils Relative 0 %   Basophils Absolute 0.0 0.0 - 0.1 K/uL  I-Stat CG4 Lactic Acid, ED  (not at  [facility])     Status: Abnormal   Collection Time: 08/14/16  9:21 PM  Result Value Ref Range   Lactic Acid, Venous 2.06 (HH) 0.5 - 1.9 mmol/L   Comment NOTIFIED PHYSICIAN   Lactic acid, plasma     Status: None   Collection Time: 08/15/16 12:59 AM  Result Value Ref Range   Lactic Acid, Venous 1.8 0.5 - 1.9 mmol/L  Basic metabolic panel     Status: Abnormal   Collection Time: 08/15/16 12:59 AM  Result Value Ref Range   Sodium 137 135 - 145 mmol/L   Potassium 4.0 3.5 - 5.1 mmol/L   Chloride 108 101 - 111 mmol/L   CO2 20 (L) 22 - 32 mmol/L   Glucose, Bld 143 (H) 65 - 99 mg/dL   BUN 24 (H) 6 - 20 mg/dL   Creatinine, Ser 1.47 (H) 0.61 - 1.24 mg/dL   Calcium 8.5 (L) 8.9 - 10.3 mg/dL   GFR calc non Af Amer 49 (L) >60 mL/min   GFR calc Af Amer 57 (L) >60 mL/min    Comment: (NOTE) The eGFR has been calculated using the CKD EPI equation. This calculation has not been validated in all clinical situations. eGFR's persistently <60 mL/min signify possible Chronic Kidney Disease.    Anion gap 9 5 - 15  CBC     Status: Abnormal   Collection Time: 08/15/16 12:59 AM  Result Value Ref Range   WBC 17.6 (H) 4.0 - 10.5 K/uL   RBC 3.42 (L) 4.22 - 5.81 MIL/uL   Hemoglobin 11.6 (L) 13.0 - 17.0 g/dL  HCT 32.7 (L) 39.0 - 52.0 %   MCV 95.6 78.0 - 100.0 fL   MCH 33.9 26.0 - 34.0 pg   MCHC 35.5 30.0 -  36.0 g/dL   RDW 13.6 11.5 - 15.5 %   Platelets 139 (L) 150 - 400 K/uL  Glucose, capillary     Status: Abnormal   Collection Time: 08/15/16  7:24 AM  Result Value Ref Range   Glucose-Capillary 127 (H) 65 - 99 mg/dL    No results found.  Review of Systems  Constitutional: Positive for chills, fever and malaise/fatigue.  HENT: Negative.   Eyes: Negative.   Respiratory: Negative.   Cardiovascular: Negative.   Gastrointestinal: Negative.   Genitourinary: Negative.   Musculoskeletal: Negative.   Skin: Negative.   Neurological: Negative.   Endo/Heme/Allergies: Negative.   Psychiatric/Behavioral: Negative.    Blood pressure 136/71, pulse (!) 123, temperature (!) 100.8 F (38.2 C), temperature source Oral, resp. rate 16, height 6' (1.829 m), weight 99 kg (218 lb 4.1 oz), SpO2 96 %. Physical Exam  Constitutional: He appears well-developed.  HENT:  Head: Normocephalic.  Eyes: Pupils are equal, round, and reactive to light.  Neck: Normal range of motion.  Respiratory: Effort normal.  GI: Soft.  Genitourinary:  Genitourinary Comments: Recent left PCNL site c/d/i. NO drainage / fluctuene.   Musculoskeletal: Normal range of motion.  Neurological: He is alert.  Skin: Skin is warm.  Psychiatric: He has a normal mood and affect.    Assessment/Plan:  1 - Urosepsis - agree with current ABX pending additional CX/sensitivity data. This appears to by pyelonephritis but not complicated by obstruction. No procedural intervention warranted. Rec in house IV ABX until afebrile x 24 hours and at least prelim CX info avail for PO transition for 2 weeks total course therapy.   2 - Nephrolithiasis - s/p succesful left PCNL for large left renal stone. Resicual contralateral sotne small and non-obstructing, observe.   I will make Dr. Alyson Ingles aware of patient's admission.   Greatly appreciate hospitalist team comanagment.   Solveig Fangman 08/15/2016, 8:18 AM

## 2016-08-15 NOTE — Progress Notes (Signed)
ID CONSULT for enterococcal bacteremia:  - will do formal consult on friday - appears to have amp S enterococcal bacteremia from urinary source  - recommend to d/c piptazo and change to ampicillin IV - repeat blood cx tomorrow to ensure he is clearing his bacteremia - recommend TTE   Ghazal Pevey B. Drue SecondSnider MD MPH [facility] [phone]

## 2016-08-15 NOTE — Care Management Note (Signed)
Case Management Note  Patient Details  Name: Steffanie RainwaterJames S Noell MRN: [id number] Date of Birth: 1950-01-19  Subjective/Objective:     uro sepsis               Action/Plan: Date:  August 15, 2016 Chart reviewed for concurrent status and case management needs. Will continue to follow patient progress. Discharge Planning: following for needs Expected discharge date: [id number] Marcelle SmilingRhonda Davis, BSN, [place]3, [place]   [phone]  Expected Discharge Date:                  Expected Discharge Plan:  Home/Self Care  In-House Referral:     Discharge planning Services  CM Consult  Post Acute Care Choice:    Choice offered to:     DME Arranged:    DME Agency:     HH Arranged:    HH Agency:     Status of Service:  In process, will continue to follow  If discussed at Long Length of Stay Meetings, dates discussed:    Additional Comments:  [name], Rhonda Lynn, RN 08/15/2016, 11:55 AM

## 2016-08-15 NOTE — Progress Notes (Signed)
Patient ID: Keith Ortiz, male   DOB: 1951/01/07, 66 y.o.   MRN: [id number]  PROGRESS NOTE    Keith RainwaterJames S Ortiz  GEX:[id number] DOB: 1951/01/07 DOA: 08/14/2016 PCP: Iona HansenJones, Penny L, NP   Brief Narrative:  66 year old male with history of NIDDM, HTN, left renal stone status post lithotripsy, ureteral stent placement on 07/18/2016 and recent admission from 07/22/2016 to 07/25/2016 for UTI secondary to enterococcus and acute kidney injury status was discharged on oral Augmentin which patient has completed and patient had stent removal last Thursday. Patient presented with fever and dysuria and suprapubic pain and was found to have a UTI with tachycardia and increased lactate and leukocytosis. Patient was admitted on intravenous antibiotics.   Assessment & Plan:   Principal Problem:   Sepsis (HCC) Active Problems:   Renal calculus, left   Essential hypertension   AKI (acute kidney injury) (HCC)   Type 2 diabetes mellitus without complication, without long-term current use of insulin (HCC)  Sepsis:  - Secondary to urinary tract infection. Follow cultures. Continue Zosyn   Probable complicated urinary tract infection in a patient with history of recent ureteral stent removal and recent treatment for enterococcal UTI - Spoke to Dr. Manny/urology on phone this morning who recommended CT of the abdomen and pelvis without contrast and he'll see the patient in consultation - Continue Zosyn. Follow cultures  Leukocytosis - Probably from sepsis. Continue antibiotics. Repeat a.m. labs    Acute kidney injury:  From sepsis/pyelo.  - Creatinine improving. Repeat a.m. labs -Continue IV fluids -Hold nephrotoxins  Diabetes:  Normoglycemic at admission -Hold metformin -Low dose SSI  Hypertension:  -Hold valsartan-HCTZ until hemodynamics clearer -Continue aspirin, statin, Zetia  Thrombocytopenia: - Probably from sepsis. Repeat a.m. labs   DVT prophylaxis: Lovenox  Code Status: FULL    Family Communication:  none at bedside   Disposition Plan:  home in 2-3 days Consulants: Urology Procedures: None   Antimicrobials: Zosyn from 08/14/2016  Subjective: Patient seen and examined at bedside. He denies current abdominal or flank pain. No current nausea or vomiting. No hematuria reported  Objective: Vitals:   08/14/16 1947 08/14/16 2344 08/15/16 0522  BP: 111/85 (!) 160/98 136/71  Pulse: (!) 110 (!) 126 (!) 123  Resp: 18 18 16   Temp: 100 F (37.8 C) 99.6 F (37.6 C) (!) 100.8 F (38.2 C)  TempSrc: Oral Oral Oral  SpO2: 96% 97% 96%  Weight:   99 kg (218 lb 4.1 oz)  Height:   6' (1.829 m)    Intake/Output Summary (Last 24 hours) at 08/15/16 1109 Last data filed at 08/15/16 0948  Gross per 24 hour  Intake              810 ml  Output              975 ml  Net             -165 ml   Filed Weights   08/15/16 0522  Weight: 99 kg (218 lb 4.1 oz)    Examination:  General exam: Appears calm and comfortable  Respiratory system: Bilateral decreased breath sound at bases Cardiovascular system: S1 & S2 heard, Tachycardic  Gastrointestinal system: Abdomen is nondistended, soft and nontender. Normal bowel sounds heard. Extremities: No cyanosis, clubbing, edema      Data Reviewed: I have personally reviewed following labs and imaging studies  CBC:  Recent Labs Lab 08/14/16 2028 08/15/16 0059  WBC 16.2* 17.6*  NEUTROABS 14.7*  --  HGB 12.3* 11.6*  HCT 34.9* 32.7*  MCV 95.1 95.6  PLT 150 139*   Basic Metabolic Panel:  Recent Labs Lab 08/14/16 2028 08/15/16 0059  NA 135 137  K 3.7 4.0  CL 103 108  CO2 21* 20*  GLUCOSE 164* 143*  BUN 28* 24*  CREATININE 1.67* 1.47*  CALCIUM 9.4 8.5*   GFR: Estimated Creatinine Clearance: 62.7 mL/min (A) (by C-G formula based on SCr of 1.47 mg/dL (H)). Liver Function Tests:  Recent Labs Lab 08/14/16 2028  AST 28  ALT 35  ALKPHOS 49  BILITOT 1.1  PROT 6.7  ALBUMIN 3.7   No results for input(s):  LIPASE, AMYLASE in the last 168 hours. No results for input(s): AMMONIA in the last 168 hours. Coagulation Profile: No results for input(s): INR, PROTIME in the last 168 hours. Cardiac Enzymes: No results for input(s): CKTOTAL, CKMB, CKMBINDEX, TROPONINI in the last 168 hours. BNP (last 3 results) No results for input(s): PROBNP in the last 8760 hours. HbA1C: No results for input(s): HGBA1C in the last 72 hours. CBG:  Recent Labs Lab 08/14/16 2338 08/15/16 0724  GLUCAP 138* 127*   Lipid Profile: No results for input(s): CHOL, HDL, LDLCALC, TRIG, CHOLHDL, LDLDIRECT in the last 72 hours. Thyroid Function Tests: No results for input(s): TSH, T4TOTAL, FREET4, T3FREE, THYROIDAB in the last 72 hours. Anemia Panel: No results for input(s): VITAMINB12, FOLATE, FERRITIN, TIBC, IRON, RETICCTPCT in the last 72 hours. Sepsis Labs:  Recent Labs Lab 08/14/16 2121 08/15/16 0059  LATICACIDVEN 2.06* 1.8    No results found for this or any previous visit (from the past 240 hour(s)).       Radiology Studies: No results found.      Scheduled Meds: . aspirin EC  81 mg Oral Daily  . atorvastatin  40 mg Oral Daily  . enoxaparin (LOVENOX) injection  40 mg Subcutaneous QHS  . ezetimibe  10 mg Oral Daily  . insulin aspart  0-5 Units Subcutaneous QHS  . insulin aspart  0-9 Units Subcutaneous TID WC  . phenazopyridine  100 mg Oral TID WC   Continuous Infusions: . sodium chloride 100 mL/hr at 08/14/16 2348  . piperacillin-tazobactam (ZOSYN)  IV Stopped (08/15/16 0924)     LOS: 1 day        Glade LloydKshitiz Carles Florea, MD [facility] Pager [phone]  If 7PM-7AM, please contact night-coverage [email or web address] Password [email or web address] 08/15/2016, 11:09 AM

## 2016-08-16 ENCOUNTER — Inpatient Hospital Stay (HOSPITAL_COMMUNITY): Payer: No Typology Code available for payment source

## 2016-08-16 DIAGNOSIS — Z810 Family history of intellectual disabilities: Secondary | ICD-10-CM

## 2016-08-16 DIAGNOSIS — B952 Enterococcus as the cause of diseases classified elsewhere: Secondary | ICD-10-CM

## 2016-08-16 DIAGNOSIS — Z794 Long term (current) use of insulin: Secondary | ICD-10-CM

## 2016-08-16 DIAGNOSIS — R7881 Bacteremia: Secondary | ICD-10-CM

## 2016-08-16 DIAGNOSIS — Z8489 Family history of other specified conditions: Secondary | ICD-10-CM

## 2016-08-16 DIAGNOSIS — I350 Nonrheumatic aortic (valve) stenosis: Secondary | ICD-10-CM

## 2016-08-16 DIAGNOSIS — Z7982 Long term (current) use of aspirin: Secondary | ICD-10-CM

## 2016-08-16 DIAGNOSIS — Z9889 Other specified postprocedural states: Secondary | ICD-10-CM

## 2016-08-16 DIAGNOSIS — Z79899 Other long term (current) drug therapy: Secondary | ICD-10-CM

## 2016-08-16 LAB — BASIC METABOLIC PANEL
Anion gap: 8 (ref 5–15)
BUN: 16 mg/dL (ref 6–20)
CHLORIDE: 104 mmol/L (ref 101–111)
CO2: 25 mmol/L (ref 22–32)
Calcium: 8.6 mg/dL — ABNORMAL LOW (ref 8.9–10.3)
Creatinine, Ser: 1.48 mg/dL — ABNORMAL HIGH (ref 0.61–1.24)
GFR calc non Af Amer: 49 mL/min — ABNORMAL LOW (ref >60)
GFR, EST AFRICAN AMERICAN: 56 mL/min — AB (ref >60)
Glucose, Bld: 153 mg/dL — ABNORMAL HIGH (ref 65–99)
POTASSIUM: 3.7 mmol/L (ref 3.5–5.1)
SODIUM: 137 mmol/L (ref 135–145)

## 2016-08-16 LAB — CBC WITH DIFFERENTIAL/PLATELET
Basophils Absolute: 0 10*3/uL (ref 0.0–0.1)
Basophils Relative: 0 %
EOS ABS: 0 10*3/uL (ref 0.0–0.7)
EOS PCT: 0 %
HEMATOCRIT: 32.2 % — AB (ref 39.0–52.0)
HEMOGLOBIN: 11.1 g/dL — AB (ref 13.0–17.0)
LYMPHS ABS: 0.8 10*3/uL (ref 0.7–4.0)
LYMPHS PCT: 5 %
MCH: 32.9 pg (ref 26.0–34.0)
MCHC: 34.5 g/dL (ref 30.0–36.0)
MCV: 95.5 fL (ref 78.0–100.0)
MONOS PCT: 8 %
Monocytes Absolute: 1.3 10*3/uL — ABNORMAL HIGH (ref 0.1–1.0)
Neutro Abs: 14.9 10*3/uL — ABNORMAL HIGH (ref 1.7–7.7)
Neutrophils Relative %: 87 %
Platelets: 121 10*3/uL — ABNORMAL LOW (ref 150–400)
RBC: 3.37 MIL/uL — AB (ref 4.22–5.81)
RDW: 14 % (ref 11.5–15.5)
WBC: 17.1 10*3/uL — ABNORMAL HIGH (ref 4.0–10.5)

## 2016-08-16 LAB — MAGNESIUM: MAGNESIUM: 1.6 mg/dL — AB (ref 1.7–2.4)

## 2016-08-16 LAB — GLUCOSE, CAPILLARY
GLUCOSE-CAPILLARY: 169 mg/dL — AB (ref 65–99)
GLUCOSE-CAPILLARY: 154 mg/dL — AB (ref 65–99)
GLUCOSE-CAPILLARY: 154 mg/dL — AB (ref 65–99)
Glucose-Capillary: 152 mg/dL — ABNORMAL HIGH (ref 65–99)

## 2016-08-16 LAB — ECHOCARDIOGRAM COMPLETE
Height: 72 in
WEIGHTICAEL: 3492.09 [oz_av]

## 2016-08-16 MED ORDER — MAGNESIUM SULFATE 2 GM/50ML IV SOLN
2.0000 g | Freq: Once | INTRAVENOUS | Status: AC
  Administered 2016-08-16: 2 g via INTRAVENOUS
  Filled 2016-08-16: qty 50

## 2016-08-16 NOTE — Progress Notes (Signed)
  Echocardiogram 2D Echocardiogram has been performed.  Daffney Greenly T Kaylaann Mountz 08/16/2016, 11:41 AM

## 2016-08-16 NOTE — Progress Notes (Signed)
Patient ID: Keith RainwaterJames S Delaguila, male   DOB: 09/29/1950, 66 y.o.   MRN: [id number]  PROGRESS NOTE    Keith RainwaterJames S Veracruz  [id number] DOB: 09/29/1950 DOA: 08/14/2016 PCP: Iona HansenJones, Penny L, NP   Brief Narrative:  66 year old male with history of NIDDM, HTN, left renal stone status post lithotripsy, ureteral stent placement on 07/18/2016 and recent admission from 07/22/2016 to 07/25/2016 for UTI secondary to enterococcus and acute kidney injury status was discharged on oral Augmentin which patient has completed and patient had stent removal last Thursday. Patient presented with fever and dysuria and suprapubic pain and was found to have a UTI with tachycardia and increased lactate and leukocytosis. Patient was admitted on intravenous antibiotics. Urology was consulted  Assessment & Plan:   Principal Problem:   Sepsis (HCC) Active Problems:   Renal calculus, left   Essential hypertension   AKI (acute kidney injury) (HCC)   Type 2 diabetes mellitus without complication, without long-term current use of insulin (HCC)  Sepsis: - Secondary to urinary tract infection. Blood cultures growing enterococcus. Follow sensitivities. Continue ampicillin as per ID recommendations   Probable complicated urinary tract infection/Acute pyelonephritis in a patient with history of recent ureteral stent removal and recent treatment for enterococcal UTI - CAT scan of the abdomen done yesterday did not show any obstructing stones. Urology consult appreciated. Follow further recommendations from urology  - Continue ampicillin. Follow recommendations from ID  Enterococcal bacteremia - Continue ampicillin. Follow ID recommendations. Follow repeat cultures done today. 2-D echo  Leukocytosis - Probably from sepsis. Continue antibiotics. Repeat a.m. labs   Acute kidney injury: From sepsis/pyelo.  - Creatinine improving. Repeat a.m. labs -Continue IV fluids -Hold nephrotoxins  Diabetes: Normoglycemic at  admission -Hold metformin -Low dose SSI  Hypertension: -Hold valsartan-HCTZ until hemodynamics clearer -Continue aspirin, statin, Zetia  Thrombocytopenia: - Probably from sepsis. Repeat a.m. Labs  Hypomagnesemia - Replace. Repeat a.m. labs   DVT prophylaxis:Lovenox Code Status:FULL Family Communication: none at bedside  Disposition Plan: home in 2-3 days Consulants: Urology Procedures: None   Antimicrobials: Zosyn from 08/14/2016 - 08/15/16 Ampicillin from 08/15/16 onwards  Subjective: Patient seen and examined at bedside. He feels better although he had fever yesterday. No nausea or vomiting. No significant flank pain.  Objective: Vitals:   08/15/16 0522 08/15/16 1416 08/15/16 2042 08/16/16 0547  BP: 136/71 125/66 (!) 152/75 117/74  Pulse: (!) 123 (!) 128 (!) 122 (!) 114  Resp: 16 20 18 18   Temp: (!) 100.8 F (38.2 C) (!) 102.9 F (39.4 C) 100.1 F (37.8 C) 98.3 F (36.8 C)  TempSrc: Oral Oral Oral Oral  SpO2: 96% 95% 96% 94%  Weight: 99 kg (218 lb 4.1 oz)     Height: 6' (1.829 m)       Intake/Output Summary (Last 24 hours) at 08/16/16 1024 Last data filed at 08/16/16 0911  Gross per 24 hour  Intake             3130 ml  Output             2400 ml  Net              730 ml   Filed Weights   08/15/16 0522  Weight: 99 kg (218 lb 4.1 oz)    Examination:  General exam: Appears calm and comfortable  Respiratory system: Bilateral decreased breath sound at bases With scattered crackles Cardiovascular system: S1 & S2 heard, tachycardic  Gastrointestinal system: Abdomen is nondistended, soft and nontender.  Normal bowel sounds heard. Central nervous system: Alert and oriented. No focal neurological deficits. Moving extremities Extremities: No cyanosis, clubbing, edema  Skin: No rashes, lesions or ulcers Psychiatry: Judgement and insight appear normal. Mood & affect appropriate.     Data Reviewed: I have personally reviewed following labs and imaging  studies  CBC:  Recent Labs Lab 08/14/16 2028 08/15/16 0059 08/16/16 0641  WBC 16.2* 17.6* 17.1*  NEUTROABS 14.7*  --  14.9*  HGB 12.3* 11.6* 11.1*  HCT 34.9* 32.7* 32.2*  MCV 95.1 95.6 95.5  PLT 150 139* 121*   Basic Metabolic Panel:  Recent Labs Lab 08/14/16 2028 08/15/16 0059 08/16/16 0641  NA 135 137 137  K 3.7 4.0 3.7  CL 103 108 104  CO2 21* 20* 25  GLUCOSE 164* 143* 153*  BUN 28* 24* 16  CREATININE 1.67* 1.47* 1.48*  CALCIUM 9.4 8.5* 8.6*  MG  --   --  1.6*   GFR: Estimated Creatinine Clearance: 62.3 mL/min (A) (by C-G formula based on SCr of 1.48 mg/dL (H)). Liver Function Tests:  Recent Labs Lab 08/14/16 2028  AST 28  ALT 35  ALKPHOS 49  BILITOT 1.1  PROT 6.7  ALBUMIN 3.7   No results for input(s): LIPASE, AMYLASE in the last 168 hours. No results for input(s): AMMONIA in the last 168 hours. Coagulation Profile: No results for input(s): INR, PROTIME in the last 168 hours. Cardiac Enzymes: No results for input(s): CKTOTAL, CKMB, CKMBINDEX, TROPONINI in the last 168 hours. BNP (last 3 results) No results for input(s): PROBNP in the last 8760 hours. HbA1C: No results for input(s): HGBA1C in the last 72 hours. CBG:  Recent Labs Lab 08/15/16 0724 08/15/16 1156 08/15/16 1659 08/15/16 2040 08/16/16 0731  GLUCAP 127* 123* 131* 142* 152*   Lipid Profile: No results for input(s): CHOL, HDL, LDLCALC, TRIG, CHOLHDL, LDLDIRECT in the last 72 hours. Thyroid Function Tests: No results for input(s): TSH, T4TOTAL, FREET4, T3FREE, THYROIDAB in the last 72 hours. Anemia Panel: No results for input(s): VITAMINB12, FOLATE, FERRITIN, TIBC, IRON, RETICCTPCT in the last 72 hours. Sepsis Labs:  Recent Labs Lab 08/14/16 2121 08/15/16 0059  LATICACIDVEN 2.06* 1.8    Recent Results (from the past 240 hour(s))  Urine culture     Status: None (Preliminary result)   Collection Time: 08/14/16  8:10 PM  Result Value Ref Range Status   Specimen Description  URINE, CLEAN CATCH  Final   Special Requests NONE  Final   Culture   Final    CULTURE REINCUBATED FOR BETTER GROWTH Performed at [facility], [address] [place], [place] [address]    Report Status PENDING  Incomplete  Blood Culture (routine x 2)     Status: None (Preliminary result)   Collection Time: 08/14/16  8:33 PM  Result Value Ref Range Status   Specimen Description BLOOD RIGHT ANTECUBITAL  Final   Special Requests   Final    BOTTLES DRAWN AEROBIC AND ANAEROBIC Blood Culture adequate volume   Culture  Setup Time   Final    GRAM POSITIVE COCCI IN CHAINS IN BOTH AEROBIC AND ANAEROBIC BOTTLES CRITICAL VALUE NOTED.  VALUE IS CONSISTENT WITH PREVIOUSLY REPORTED AND CALLED VALUE. Performed at [facility], [address] [place], [place] [address]    [facility]  Final   Report Status PENDING  Incomplete  Blood Culture (routine x 2)     Status: None (Preliminary result)   Collection Time: 08/14/16  9:04 PM  Result Value Ref Range Status   Specimen Description BLOOD LEFT FOREARM  Final   Special Requests   Final    BOTTLES DRAWN AEROBIC AND ANAEROBIC Blood Culture adequate volume   Culture  Setup Time   Final    GRAM POSITIVE COCCI IN CHAINS IN BOTH AEROBIC AND ANAEROBIC BOTTLES CRITICAL RESULT CALLED TO, READ BACK BY AND VERIFIED WITH: Azzie Glatter Pharm.D. 15:50 08/15/16 ([email or web address]) Performed at [facility], [address], [place], [place] [address]    Culture GRAM POSITIVE COCCI  Final   Report Status PENDING  Incomplete  Blood Culture ID Panel (Reflexed)     Status: Abnormal   Collection Time: 08/14/16  9:04 PM  Result Value Ref Range Status   Enterococcus species DETECTED (A) NOT DETECTED Final    Comment: CRITICAL RESULT CALLED TO, READ BACK BY AND VERIFIED WITH: Mardee Postin.D. 15:50 08/15/16 ([email or web address])    Vancomycin resistance NOT DETECTED NOT DETECTED Final   Listeria monocytogenes NOT DETECTED NOT DETECTED Final   Staphylococcus  species NOT DETECTED NOT DETECTED Final   Staphylococcus aureus NOT DETECTED NOT DETECTED Final   Streptococcus species NOT DETECTED NOT DETECTED Final   Streptococcus agalactiae NOT DETECTED NOT DETECTED Final   Streptococcus pneumoniae NOT DETECTED NOT DETECTED Final   Streptococcus pyogenes NOT DETECTED NOT DETECTED Final   Acinetobacter baumannii NOT DETECTED NOT DETECTED Final   Enterobacteriaceae species NOT DETECTED NOT DETECTED Final   Enterobacter cloacae complex NOT DETECTED NOT DETECTED Final   Escherichia coli NOT DETECTED NOT DETECTED Final   Klebsiella oxytoca NOT DETECTED NOT DETECTED Final   Klebsiella pneumoniae NOT DETECTED NOT DETECTED Final   Proteus species NOT DETECTED NOT DETECTED Final   Serratia marcescens NOT DETECTED NOT DETECTED Final   Haemophilus influenzae NOT DETECTED NOT DETECTED Final   Neisseria meningitidis NOT DETECTED NOT DETECTED Final   Pseudomonas aeruginosa NOT DETECTED NOT DETECTED Final   Candida albicans NOT DETECTED NOT DETECTED Final   Candida glabrata NOT DETECTED NOT DETECTED Final   Candida krusei NOT DETECTED NOT DETECTED Final   Candida parapsilosis NOT DETECTED NOT DETECTED Final   Candida tropicalis NOT DETECTED NOT DETECTED Final    Comment: Performed at [facility], [address], [place], [place] [address]         Radiology Studies: Ct Abdomen Pelvis Wo Contrast  Result Date: 08/15/2016 CLINICAL DATA:  NIDDM, HTN, left renal stone status post lithotripsy, ureteral stent placement on 07/18/2016 and recent admission from 07/22/2016 to 07/25/2016 for UTI secondary to enterococcus and acute kidney injury status was discharged on oral Augmentin which patient has completed and patient had stent removal last Thursday. Patient presented with fever and dysuria and suprapubic pain and was found to have a UTI with tachycardia and increased lactate and leukocytosis. Patient was admitted on intravenous antibiotics. EXAM: CT ABDOMEN  AND PELVIS WITHOUT CONTRAST TECHNIQUE: Multidetector CT imaging of the abdomen and pelvis was performed following the standard protocol without IV contrast. COMPARISON:  07/11/2016 FINDINGS: Lower chest: Scattered coronary calcifications. Dependent atelectasis posteriorly in the lung bases left greater than right as before. Hepatobiliary: Fatty liver without focal lesion. Innumerable small partially calcified stones fill the nondilated gallbladder. No biliary ductal dilatation. Pancreas: Unremarkable. No pancreatic ductal dilatation or surrounding inflammatory changes. Spleen: Normal in size without focal abnormality. Adrenals/Urinary Tract: Normal adrenals. Interval removal of left percutaneous nephrostomy catheter and ureteral stent. Bilateral nephrolithiasis, largest stone on the left in the lower  pole 6 mm, on the right in the lower pole 7 mm. Mild left pelvicaliectasis and ureterectasis without evidence of obstructing calculus. Some increase in streaky inflammatory/edematous changes in the left perinephric fascia. Urinary bladder physiologically distended, with 12 mm calculus in its dependent aspect. Stomach/Bowel: Stomach and small bowel are nondilated. Appendix not identified. The colon is nondistended, unremarkable. Vascular/Lymphatic: Scattered aortoiliac arterial calcifications without aneurysm. No abdominal or pelvic adenopathy. Bilateral pelvic phleboliths. Reproductive: Moderate prostatic enlargement with central coarse calcifications. Other: No ascites.  No free air. Musculoskeletal: No acute or significant osseous findings. IMPRESSION: 1. Left pelvicaliectasis and mild ureterectasis without evidence of obstructing calculus, post removal of nephrostomy and ureteral stent. There is also some increase in left perinephric inflammatory/edematous changes. 2. Bilateral nephrolithiasis as above. 3. 12 mm calculus in the urinary bladder. 4. Coronary and Aortic Atherosclerosis (ICD10-170.0) 5. Fatty liver 6.  Cholelithiasis Electronically Signed   By: Corlis Leak M.D.   On: 08/15/2016 12:19        Scheduled Meds: . aspirin EC  81 mg Oral Daily  . atorvastatin  40 mg Oral Daily  . enoxaparin (LOVENOX) injection  40 mg Subcutaneous QHS  . ezetimibe  10 mg Oral Daily  . insulin aspart  0-5 Units Subcutaneous QHS  . insulin aspart  0-9 Units Subcutaneous TID WC  . phenazopyridine  100 mg Oral TID WC   Continuous Infusions: . sodium chloride 100 mL/hr at 08/16/16 0318  . ampicillin (OMNIPEN) IV 2 g (08/16/16 0524)     LOS: 2 days        Glade Lloyd, MD [facility] Pager [phone]  If 7PM-7AM, please contact night-coverage [email or web address] Password [email or web address] 08/16/2016, 10:24 AM

## 2016-08-16 NOTE — Progress Notes (Signed)
[facility]  [facility] Coordinator following Mr. Keith Ortiz's hospital course with ID team for possible home IV ABX needs at DC.  If patient discharges after hours, please call [phone].   [name] 08/16/2016, 9:48 AM

## 2016-08-16 NOTE — Consult Note (Signed)
[facility]ious Disease  Total days of antibiotics 3        Day 2 amp               Reason for Consult: enterococcal bacteremia/pyelonephritis   Referring Physician: Starla Link  Principal Problem:   Sepsis ([place]) Active Problems:   Renal calculus, left   Essential hypertension   AKI (acute kidney injury) ([place])   Type 2 diabetes mellitus without complication, without long-term current use of insulin (H[facility])    HPI: Keith Ortiz is a 66 y.o. 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 he underwent left percutaneous nephrostolithotomy for stone, and placement of double J ureteral stenst and nephrostomy tube on 6/25. He followed up with dr Alyson Ingles on 7/5 to doe 2nd look percutaneous nephrostolithotomy. The nephrostomy tube was removed and flexible nephroscope was used to remove residual fragments. He was readmitted on 7/9 for nausea, weakness, chills and found to have temp 102.68F, P 130, low blood pressure concerning for sepsis related to urinary source. Urine cx at that time showed efaecalis. He was discharged on 14d course of amox/clav which he completed. Last Thursday, he had stent removed and started to have fever and dysuria x 1 day prior to admit.  He deniedany flank pain, no vomiting, no dizziness, no confusion, no syncope. He's had no cough, sputum production.lab srevealed wbc of 17.6K, UA +. Infectious work up shoed +blood cx with enterococcus and urine cx showing e.faecalis. Fevers dissipated but still having nightsweat/chills/temp dysregulation. He had abd CT that did not show any obstructing stone.  Past Medical History:  Diagnosis Date  . Arthritis   . Diabetes mellitus without complication ([place])    type 2  . History of kidney stones   . HLD (hyperlipidemia)   . Hypertension   . Pneumonia     Allergies: No  Known Allergies  MEDICATIONS: . aspirin EC  81 mg Oral Daily  . atorvastatin  40 mg Oral Daily  . enoxaparin (LOVENOX) injection  40 mg Subcutaneous QHS  . ezetimibe  10 mg Oral Daily  . insulin aspart  0-5 Units Subcutaneous QHS  . insulin aspart  0-9 Units Subcutaneous TID WC  . phenazopyridine  100 mg Oral TID WC    Social History  Substance Use Topics  . Smoking status: Never Smoker  . Smokeless tobacco: Never Used  . Alcohol use Yes     Comment: occasionally  - he is a musician, percussionist -teaches   Family History  Problem Relation Age of Onset  . Dementia Mother   . Hemochromatosis Brother   - mother hx of lymphoma   Review of Systems  Constitutional: positive for fever, chills, diaphoresis, activity change, appetite change, fatigue and unexpected weight change.  HENT: Negative for congestion, sore throat, rhinorrhea, sneezing, trouble swallowing and sinus pressure.  Eyes: Negative for photophobia and visual disturbance.  Respiratory: Negative for cough, chest tightness, shortness of breath, wheezing and stridor.  Cardiovascular: Negative for chest pain, palpitations and leg swelling.  Gastrointestinal: Negative for nausea, vomiting, abdominal pain, diarrhea, constipation, blood in stool, abdominal distention and anal bleeding.  Genitourinary: positive for dysuria, but negative for hematuria, flank pain and difficulty urinating.  Musculoskeletal: Negative for myalgias, back pain, joint swelling, arthralgias and gait problem.  Skin: Negative for color change, pallor,  rash and wound.  Neurological: Negative for dizziness, tremors, weakness and light-headedness.  Hematological: Negative for adenopathy. Does not bruise/bleed easily.  Psychiatric/Behavioral: Negative for behavioral problems, confusion, sleep disturbance, dysphoric mood, decreased concentration and agitation.     OBJECTIVE: Temp:  [98.3 F (36.8 C)-100.1 F (37.8 C)] 99.4 F (37.4 C) (08/03  1453) Pulse Rate:  [107-122] 107 (08/03 1453) Resp:  [18] 18 (08/03 1453) BP: (117-152)/(65-75) 121/65 (08/03 1453) SpO2:  [94 %-96 %] 95 % (08/03 1453) Physical Exam  Constitutional: He is oriented to person, place, and time. He appears well-developed and well-nourished. No distress.  HENT:  Mouth/Throat: Oropharynx is clear and moist. No oropharyngeal exudate.  Cardiovascular: Normal rate, regular rhythm and normal heart sounds. Exam reveals no gallop and no friction rub.  No murmur heard.  Pulmonary/Chest: Effort normal and breath sounds normal. No respiratory distress. He has no wheezes.  Abdominal: Soft. Bowel sounds are normal. He exhibits no distension. There is no tenderness.  Lymphadenopathy:  He has no cervical adenopathy.  Neurological: He is alert and oriented to person, place, and time.  Skin: Skin is warm and dry. No rash noted. No erythema.  Psychiatric: He has a normal mood and affect. His behavior is normal.     LABS: Results for orders placed or performed during the hospital encounter of 08/14/16 (from the past 48 hour(s))  Urinalysis, Routine w reflex microscopic     Status: Abnormal   Collection Time: 08/14/16  8:10 PM  Result Value Ref Range   Color, Urine AMBER (A) YELLOW    Comment: BIOCHEMICALS MAY BE AFFECTED BY COLOR   APPearance CLEAR CLEAR   Specific Gravity, Urine 1.015 1.005 - 1.030   pH 5.0 5.0 - 8.0   Glucose, UA NEGATIVE NEGATIVE mg/dL   Hgb urine dipstick SMALL (A) NEGATIVE   Bilirubin Urine NEGATIVE NEGATIVE   Ketones, ur NEGATIVE NEGATIVE mg/dL   Protein, ur NEGATIVE NEGATIVE mg/dL   Nitrite POSITIVE (A) NEGATIVE   Leukocytes, UA NEGATIVE NEGATIVE   RBC / HPF 0-5 0 - 5 RBC/hpf   WBC, UA TOO NUMEROUS TO COUNT 0 - 5 WBC/hpf   Bacteria, UA RARE (A) NONE SEEN   Squamous Epithelial / LPF NONE SEEN NONE SEEN   Mucous PRESENT   Urine culture     Status: Abnormal (Preliminary result)   Collection Time: 08/14/16  8:10 PM  Result Value Ref Range    Specimen Description URINE, CLEAN CATCH    Special Requests NONE    Culture (A)     >=100,000 COLONIES/mL ENTEROCOCCUS FAECALIS SUSCEPTIBILITIES TO FOLLOW Performed at [facility], [address], [place], [place] [address]    Report Status PENDING   Comprehensive metabolic panel     Status: Abnormal   Collection Time: 08/14/16  8:28 PM  Result Value Ref Range   Sodium 135 135 - 145 mmol/L   Potassium 3.7 3.5 - 5.1 mmol/L   Chloride 103 101 - 111 mmol/L   CO2 21 (L) 22 - 32 mmol/L   Glucose, Bld 164 (H) 65 - 99 mg/dL   BUN 28 (H) 6 - 20 mg/dL   Creatinine, Ser 1.67 (H) 0.61 - 1.24 mg/dL   Calcium 9.4 8.9 - 10.3 mg/dL   Total Protein 6.7 6.5 - 8.1 g/dL   Albumin 3.7 3.5 - 5.0 g/dL   AST 28 15 - 41 U/L   ALT 35 17 - 63 U/L   Alkaline Phosphatase 49 38 - 126 U/L  Total Bilirubin 1.1 0.3 - 1.2 mg/dL   GFR calc non Af Amer 42 (L) >60 mL/min   GFR calc Af Amer 49 (L) >60 mL/min    Comment: (NOTE) The eGFR has been calculated using the CKD EPI equation. This calculation has not been validated in all clinical situations. eGFR's persistently <60 mL/min signify possible Chronic Kidney Disease.    Anion gap 11 5 - 15  CBC WITH DIFFERENTIAL     Status: Abnormal   Collection Time: 08/14/16  8:28 PM  Result Value Ref Range   WBC 16.2 (H) 4.0 - 10.5 K/uL   RBC 3.67 (L) 4.22 - 5.81 MIL/uL   Hemoglobin 12.3 (L) 13.0 - 17.0 g/dL   HCT 34.9 (L) 39.0 - 52.0 %   MCV 95.1 78.0 - 100.0 fL   MCH 33.5 26.0 - 34.0 pg   MCHC 35.2 30.0 - 36.0 g/dL   RDW 13.5 11.5 - 15.5 %   Platelets 150 150 - 400 K/uL   Neutrophils Relative % 91 %   Neutro Abs 14.7 (H) 1.7 - 7.7 K/uL   Lymphocytes Relative 3 %   Lymphs Abs 0.5 (L) 0.7 - 4.0 K/uL   Monocytes Relative 6 %   Monocytes Absolute 1.0 0.1 - 1.0 K/uL   Eosinophils Relative 0 %   Eosinophils Absolute 0.0 0.0 - 0.7 K/uL   Basophils Relative 0 %   Basophils Absolute 0.0 0.0 - 0.1 K/uL  Blood Culture (routine x 2)     Status: Abnormal  (Preliminary result)   Collection Time: 08/14/16  8:33 PM  Result Value Ref Range   Specimen Description BLOOD RIGHT ANTECUBITAL    Special Requests      BOTTLES DRAWN AEROBIC AND ANAEROBIC Blood Culture adequate volume   Culture  Setup Time      GRAM POSITIVE COCCI IN CHAINS IN BOTH AEROBIC AND ANAEROBIC BOTTLES CRITICAL VALUE NOTED.  VALUE IS CONSISTENT WITH PREVIOUSLY REPORTED AND CALLED VALUE.    Culture (A)     ENTEROCOCCUS FAECALIS SUSCEPTIBILITIES TO FOLLOW Performed at [facility], [place] [address] [place], [place] [address]    Report Status PENDING   Blood Culture (routine x 2)     Status: Abnormal (Preliminary result)   Collection Time: 08/14/16  9:04 PM  Result Value Ref Range   Specimen Description BLOOD LEFT FOREARM    Special Requests      BOTTLES DRAWN AEROBIC AND ANAEROBIC Blood Culture adequate volume   Culture  Setup Time      GRAM POSITIVE COCCI IN CHAINS IN BOTH AEROBIC AND ANAEROBIC BOTTLES CRITICAL RESULT CALLED TO, READ BACK BY AND VERIFIED WITH: Christean Grief Pharm.D. 15:50 08/15/16 ([email or web address]) Performed at [facility], [place] [address], [place], [place] [address]    Culture ENTEROCOCCUS FAECALIS (A)    Report Status PENDING   Blood Culture ID Panel (Reflexed)     Status: Abnormal   Collection Time: 08/14/16  9:04 PM  Result Value Ref Range   Enterococcus species DETECTED (A) NOT DETECTED    Comment: CRITICAL RESULT CALLED TO, READ BACK BY AND VERIFIED WITH: Mila Merry.D. 15:50 08/15/16 ([email or web address])    Vancomycin resistance NOT DETECTED NOT DETECTED   Listeria monocytogenes NOT DETECTED NOT DETECTED   Staphylococcus species NOT DETECTED NOT DETECTED   Staphylococcus aureus NOT DETECTED NOT DETECTED   Streptococcus species NOT DETECTED NOT DETECTED   Streptococcus agalactiae NOT DETECTED NOT DETECTED   Streptococcus pneumoniae NOT DETECTED NOT DETECTED  Streptococcus pyogenes NOT DETECTED NOT DETECTED   Acinetobacter baumannii NOT DETECTED NOT  DETECTED   Enterobacteriaceae species NOT DETECTED NOT DETECTED   Enterobacter cloacae complex NOT DETECTED NOT DETECTED   Escherichia coli NOT DETECTED NOT DETECTED   Klebsiella oxytoca NOT DETECTED NOT DETECTED   Klebsiella pneumoniae NOT DETECTED NOT DETECTED   Proteus species NOT DETECTED NOT DETECTED   Serratia marcescens NOT DETECTED NOT DETECTED   Haemophilus influenzae NOT DETECTED NOT DETECTED   Neisseria meningitidis NOT DETECTED NOT DETECTED   Pseudomonas aeruginosa NOT DETECTED NOT DETECTED   Candida albicans NOT DETECTED NOT DETECTED   Candida glabrata NOT DETECTED NOT DETECTED   Candida krusei NOT DETECTED NOT DETECTED   Candida parapsilosis NOT DETECTED NOT DETECTED   Candida tropicalis NOT DETECTED NOT DETECTED    Comment: Performed at [facility], [place] [address] [place], [place] [address]  I-Stat CG4 Lactic Acid, ED  (not at  [facility])     Status: Abnormal   Collection Time: 08/14/16  9:21 PM  Result Value Ref Range   Lactic Acid, Venous 2.06 (HH) 0.5 - 1.9 mmol/L   Comment NOTIFIED PHYSICIAN   Glucose, capillary     Status: Abnormal   Collection Time: 08/14/16 11:38 PM  Result Value Ref Range   Glucose-Capillary 138 (H) 65 - 99 mg/dL  Lactic acid, plasma     Status: None   Collection Time: 08/15/16 12:59 AM  Result Value Ref Range   Lactic Acid, Venous 1.8 0.5 - 1.9 mmol/L  Basic metabolic panel     Status: Abnormal   Collection Time: 08/15/16 12:59 AM  Result Value Ref Range   Sodium 137 135 - 145 mmol/L   Potassium 4.0 3.5 - 5.1 mmol/L   Chloride 108 101 - 111 mmol/L   CO2 20 (L) 22 - 32 mmol/L   Glucose, Bld 143 (H) 65 - 99 mg/dL   BUN 24 (H) 6 - 20 mg/dL   Creatinine, Ser 1.47 (H) 0.61 - 1.24 mg/dL   Calcium 8.5 (L) 8.9 - 10.3 mg/dL   GFR calc non Af Amer 49 (L) >60 mL/min   GFR calc Af Amer 57 (L) >60 mL/min    Comment: (NOTE) The eGFR has been calculated using the CKD EPI equation. This calculation has not been validated in all clinical  situations. eGFR's persistently <60 mL/min signify possible Chronic Kidney Disease.    Anion gap 9 5 - 15  CBC     Status: Abnormal   Collection Time: 08/15/16 12:59 AM  Result Value Ref Range   WBC 17.6 (H) 4.0 - 10.5 K/uL   RBC 3.42 (L) 4.22 - 5.81 MIL/uL   Hemoglobin 11.6 (L) 13.0 - 17.0 g/dL   HCT 32.7 (L) 39.0 - 52.0 %   MCV 95.6 78.0 - 100.0 fL   MCH 33.9 26.0 - 34.0 pg   MCHC 35.5 30.0 - 36.0 g/dL   RDW 13.6 11.5 - 15.5 %   Platelets 139 (L) 150 - 400 K/uL  Glucose, capillary     Status: Abnormal   Collection Time: 08/15/16  7:24 AM  Result Value Ref Range   Glucose-Capillary 127 (H) 65 - 99 mg/dL  Glucose, capillary     Status: Abnormal   Collection Time: 08/15/16 11:56 AM  Result Value Ref Range   Glucose-Capillary 123 (H) 65 - 99 mg/dL  Glucose, capillary     Status: Abnormal   Collection Time: 08/15/16  4:59 PM  Result Value Ref Range  Glucose-Capillary 131 (H) 65 - 99 mg/dL  Glucose, capillary     Status: Abnormal   Collection Time: 08/15/16  8:40 PM  Result Value Ref Range   Glucose-Capillary 142 (H) 65 - 99 mg/dL  CBC with Differential/Platelet     Status: Abnormal   Collection Time: 08/16/16  6:41 AM  Result Value Ref Range   WBC 17.1 (H) 4.0 - 10.5 K/uL   RBC 3.37 (L) 4.22 - 5.81 MIL/uL   Hemoglobin 11.1 (L) 13.0 - 17.0 g/dL   HCT 32.2 (L) 39.0 - 52.0 %   MCV 95.5 78.0 - 100.0 fL   MCH 32.9 26.0 - 34.0 pg   MCHC 34.5 30.0 - 36.0 g/dL   RDW 14.0 11.5 - 15.5 %   Platelets 121 (L) 150 - 400 K/uL   Neutrophils Relative % 87 %   Neutro Abs 14.9 (H) 1.7 - 7.7 K/uL   Lymphocytes Relative 5 %   Lymphs Abs 0.8 0.7 - 4.0 K/uL   Monocytes Relative 8 %   Monocytes Absolute 1.3 (H) 0.1 - 1.0 K/uL   Eosinophils Relative 0 %   Eosinophils Absolute 0.0 0.0 - 0.7 K/uL   Basophils Relative 0 %   Basophils Absolute 0.0 0.0 - 0.1 K/uL  Basic metabolic panel     Status: Abnormal   Collection Time: 08/16/16  6:41 AM  Result Value Ref Range   Sodium 137 135 - 145  mmol/L   Potassium 3.7 3.5 - 5.1 mmol/L   Chloride 104 101 - 111 mmol/L   CO2 25 22 - 32 mmol/L   Glucose, Bld 153 (H) 65 - 99 mg/dL   BUN 16 6 - 20 mg/dL   Creatinine, Ser 1.48 (H) 0.61 - 1.24 mg/dL   Calcium 8.6 (L) 8.9 - 10.3 mg/dL   GFR calc non Af Amer 49 (L) >60 mL/min   GFR calc Af Amer 56 (L) >60 mL/min    Comment: (NOTE) The eGFR has been calculated using the CKD EPI equation. This calculation has not been validated in all clinical situations. eGFR's persistently <60 mL/min signify possible Chronic Kidney Disease.    Anion gap 8 5 - 15  Magnesium     Status: Abnormal   Collection Time: 08/16/16  6:41 AM  Result Value Ref Range   Magnesium 1.6 (L) 1.7 - 2.4 mg/dL  Glucose, capillary     Status: Abnormal   Collection Time: 08/16/16  7:31 AM  Result Value Ref Range   Glucose-Capillary 152 (H) 65 - 99 mg/dL  Glucose, capillary     Status: Abnormal   Collection Time: 08/16/16 12:17 PM  Result Value Ref Range   Glucose-Capillary 169 (H) 65 - 99 mg/dL    MICRO: 8/1 bcx x 2 e.faecalis 8/1 ucx e.faecalis 8/3 bcx pending IMAGING: Ct Abdomen Pelvis Wo Contrast  Result Date: 08/15/2016 CLINICAL DATA:  NIDDM, HTN, left renal stone status post lithotripsy, ureteral stent placement on 07/18/2016 and recent admission from 07/22/2016 to 07/25/2016 for UTI secondary to enterococcus and acute kidney injury status was discharged on oral Augmentin which patient has completed and patient had stent removal last Thursday. Patient presented with fever and dysuria and suprapubic pain and was found to have a UTI with tachycardia and increased lactate and leukocytosis. Patient was admitted on intravenous antibiotics. EXAM: CT ABDOMEN AND PELVIS WITHOUT CONTRAST TECHNIQUE: Multidetector CT imaging of the abdomen and pelvis was performed following the standard protocol without IV contrast. COMPARISON:  07/11/2016 FINDINGS: Lower chest: Scattered coronary  calcifications. Dependent atelectasis  posteriorly in the lung bases left greater than right as before. Hepatobiliary: Fatty liver without focal lesion. Innumerable small partially calcified stones fill the nondilated gallbladder. No biliary ductal dilatation. Pancreas: Unremarkable. No pancreatic ductal dilatation or surrounding inflammatory changes. Spleen: Normal in size without focal abnormality. Adrenals/Urinary Tract: Normal adrenals. Interval removal of left percutaneous nephrostomy catheter and ureteral stent. Bilateral nephrolithiasis, largest stone on the left in the lower pole 6 mm, on the right in the lower pole 7 mm. Mild left pelvicaliectasis and ureterectasis without evidence of obstructing calculus. Some increase in streaky inflammatory/edematous changes in the left perinephric fascia. Urinary bladder physiologically distended, with 12 mm calculus in its dependent aspect. Stomach/Bowel: Stomach and small bowel are nondilated. Appendix not identified. The colon is nondistended, unremarkable. Vascular/Lymphatic: Scattered aortoiliac arterial calcifications without aneurysm. No abdominal or pelvic adenopathy. Bilateral pelvic phleboliths. Reproductive: Moderate prostatic enlargement with central coarse calcifications. Other: No ascites.  No free air. Musculoskeletal: No acute or significant osseous findings. IMPRESSION: 1. Left pelvicaliectasis and mild ureterectasis without evidence of obstructing calculus, post removal of nephrostomy and ureteral stent. There is also some increase in left perinephric inflammatory/edematous changes. 2. Bilateral nephrolithiasis as above. 3. 12 mm calculus in the urinary bladder. 4. Coronary and Aortic Atherosclerosis (ICD10-170.0) 5. Fatty liver 6. Cholelithiasis Electronically Signed   By: Lucrezia Europe M.D.   On: 08/15/2016 12:19    Assessment/Plan:  66yo M with hx of enterococcal sepsis related to nephrolithiasis in mid July recently had stent removal and admitted for fever, dysuria found to have  enterococcal faecalis urinary tract infection with secondary bacteremia  - recommend to treat with IV ampicillin x 14 days using 8/3 as day 1 - if blood cx from 8/3 are NGTD at 48hr, can place picc line  TTE showing nonoscillating density on MV. Would recommend TEE to rule out endocarditis. If TEE positive, then will need to extend IV abtx.  Health maintenance = recommend hep c ab screening

## 2016-08-17 DIAGNOSIS — N200 Calculus of kidney: Secondary | ICD-10-CM

## 2016-08-17 LAB — CBC WITH DIFFERENTIAL/PLATELET
BASOS ABS: 0 10*3/uL (ref 0.0–0.1)
Basophils Relative: 0 %
EOS ABS: 0.1 10*3/uL (ref 0.0–0.7)
EOS PCT: 1 %
HCT: 29.3 % — ABNORMAL LOW (ref 39.0–52.0)
Hemoglobin: 10.1 g/dL — ABNORMAL LOW (ref 13.0–17.0)
LYMPHS ABS: 1.1 10*3/uL (ref 0.7–4.0)
Lymphocytes Relative: 10 %
MCH: 33.3 pg (ref 26.0–34.0)
MCHC: 34.5 g/dL (ref 30.0–36.0)
MCV: 96.7 fL (ref 78.0–100.0)
Monocytes Absolute: 1.3 10*3/uL — ABNORMAL HIGH (ref 0.1–1.0)
Monocytes Relative: 12 %
Neutro Abs: 8.7 10*3/uL — ABNORMAL HIGH (ref 1.7–7.7)
Neutrophils Relative %: 78 %
PLATELETS: 116 10*3/uL — AB (ref 150–400)
RBC: 3.03 MIL/uL — AB (ref 4.22–5.81)
RDW: 14 % (ref 11.5–15.5)
WBC: 11.3 10*3/uL — AB (ref 4.0–10.5)

## 2016-08-17 LAB — CULTURE, BLOOD (ROUTINE X 2)
SPECIAL REQUESTS: ADEQUATE
Special Requests: ADEQUATE

## 2016-08-17 LAB — URINE CULTURE: Culture: >=100000 — AB

## 2016-08-17 LAB — GLUCOSE, CAPILLARY
GLUCOSE-CAPILLARY: 153 mg/dL — AB (ref 65–99)
Glucose-Capillary: 117 mg/dL — ABNORMAL HIGH (ref 65–99)
Glucose-Capillary: 121 mg/dL — ABNORMAL HIGH (ref 65–99)
Glucose-Capillary: 144 mg/dL — ABNORMAL HIGH (ref 65–99)

## 2016-08-17 LAB — BASIC METABOLIC PANEL
Anion gap: 8 (ref 5–15)
BUN: 14 mg/dL (ref 6–20)
CALCIUM: 8.5 mg/dL — AB (ref 8.9–10.3)
CO2: 24 mmol/L (ref 22–32)
CREATININE: 1.21 mg/dL (ref 0.61–1.24)
Chloride: 108 mmol/L (ref 101–111)
GFR calc Af Amer: >60 mL/min (ref >60)
Glucose, Bld: 117 mg/dL — ABNORMAL HIGH (ref 65–99)
POTASSIUM: 3.7 mmol/L (ref 3.5–5.1)
SODIUM: 140 mmol/L (ref 135–145)

## 2016-08-17 LAB — C-REACTIVE PROTEIN: CRP: 26.7 mg/dL — ABNORMAL HIGH (ref <1.0)

## 2016-08-17 LAB — MAGNESIUM: MAGNESIUM: 1.9 mg/dL (ref 1.7–2.4)

## 2016-08-17 NOTE — Progress Notes (Signed)
Patient ID: Keith Ortiz, male   DOB: [birth year], 66 y.o.   MRN: [id number]  PROGRESS NOTE    Keith Ortiz  OVF:[id number] DOB: [birth year] DOA: 08/14/2016 PCP: Iona Hansen, NP   Brief Narrative:  66 year old male with history of NIDDM, HTN, left renal stone status post lithotripsy,ureteral stent placement on 07/18/2016 and recent admission from 07/22/2016 to 07/25/2016 for UTI secondary to enterococcus and acute kidney injury status was discharged on oral Augmentin which patient has completed and patient had stent removal last Thursday. Patient presented with fever and dysuria and suprapubic pain and was found to have a UTI with tachycardia and increased lactate and leukocytosis. Patient was admitted on intravenous antibiotics. Urology was consulted. Blood culture grew enterococcus faecalis. ID has been consulted.   Assessment & Plan:   Principal Problem:   Sepsis (HCC) Active Problems:   Renal calculus, left   Essential hypertension   AKI (acute kidney injury) (HCC)   Type 2 diabetes mellitus without complication, without long-term current use of insulin (HCC)   Sepsis: - Secondary to urinary tract infection. Blood cultures growing enterococcus. Follow sensitivities. Continue ampicillin as per ID recommendations   Probable complicated urinary tract infection/Acute pyelonephritis in a patient with history of recent ureteral stent removal and recent treatment for enterococcal UTI - CAT scan of the abdomen did not show any obstructing stones. Urology consult appreciated. Follow further recommendations from urology  - Continue ampicillin. Follow recommendations from ID  Enterococcus fecalis bacteremia - Continue ampicillin. Follow ID recommendations. Follow repeat cultures done on 08/16/16.  - 2-D echo done on 08/15/16 was suspicious for non-oscillating density on mitral valve. We will arrange for TEE per ID recommendations. Spoke to on call cardiologist Dr. Duke Salvia who will arrange  for TEE probably on Tuesday.  Leukocytosis - Probably from sepsis. Continue antibiotics.  -Improving, Repeat a.m. labs  Acute kidney injury: From sepsis/pyelo.  - Creatinine improving. Repeat a.m. labs  Diabetes: Normoglycemic at admission -Hold metformin -Low dose SSI  Hypertension: -Blood pressure stable. Antihypertensives on hold for now -Continue aspirin, statin, Zetia  Thrombocytopenia: - Probably from sepsis. Repeat a.m. Labs  Hypomagnesemia - Improved   DVT prophylaxis:Lovenox Code Status:FULL Family Communication:none at bedside  Disposition Plan:home in 3-4 days Consulants:Urology; ID; cardiology for TEE Procedures: 2-D echo on 08/15/16 Impressions:  - Normal LV systolic function; mild diastolic dysfunction;   calcified aortic valve with mild AS (mean gradient 10 mmHg);   thickened MV with nonoscillating density on anterior leaflet   likely related to calcification; mild MR; trace TR with mildly   elevated pulmonary pressure. Consider TEE in 1-2 weeks to   reassess MV density.  Antimicrobials: Zosyn from 08/14/2016 - 08/15/16 Ampicillin from 08/15/16 onwards  Subjective: Patient seen and examined at bedside. He denies any overnight fever, nausea or vomiting.  Objective: Vitals:   08/16/16 1453 08/16/16 2205 08/16/16 2247 08/17/16 0644  BP: 121/65 122/67 126/68 123/82  Pulse: (!) 107 (!) 101 100 80  Resp: 18  18 17   Temp: 99.4 F (37.4 C) 99.1 F (37.3 C) 98.5 F (36.9 C) 98.4 F (36.9 C)  TempSrc: Oral Oral Oral Oral  SpO2: 95% 95% 96% 98%  Weight:      Height:        Intake/Output Summary (Last 24 hours) at 08/17/16 1124 Last data filed at 08/17/16 0810  Gross per 24 hour  Intake          2260.83 ml  Output  2402 ml  Net          -141.17 ml   Filed Weights   08/15/16 0522  Weight: 99 kg (218 lb 4.1 oz)    Examination:  General exam: Appears calm and comfortable  Respiratory system: Bilateral  decreased breath sound at bases Cardiovascular system: S1 & S2 heard, Rate controlled. No murmurs  Gastrointestinal system: Abdomen is nondistended, soft and nontender. Normal bowel sounds heard. Extremities: No cyanosis, clubbing, edema      Data Reviewed: I have personally reviewed following labs and imaging studies  CBC:  Recent Labs Lab 08/14/16 2028 08/15/16 0059 08/16/16 0641 08/17/16 0553  WBC 16.2* 17.6* 17.1* 11.3*  NEUTROABS 14.7*  --  14.9* 8.7*  HGB 12.3* 11.6* 11.1* 10.1*  HCT 34.9* 32.7* 32.2* 29.3*  MCV 95.1 95.6 95.5 96.7  PLT 150 139* 121* 116*   Basic Metabolic Panel:  Recent Labs Lab 08/14/16 2028 08/15/16 0059 08/16/16 0641 08/17/16 0553  NA 135 137 137 140  K 3.7 4.0 3.7 3.7  CL 103 108 104 108  CO2 21* 20* 25 24  GLUCOSE 164* 143* 153* 117*  BUN 28* 24* 16 14  CREATININE 1.67* 1.47* 1.48* 1.21  CALCIUM 9.4 8.5* 8.6* 8.5*  MG  --   --  1.6* 1.9   GFR: Estimated Creatinine Clearance: 76.2 mL/min (by C-G formula based on SCr of 1.21 mg/dL). Liver Function Tests:  Recent Labs Lab 08/14/16 2028  AST 28  ALT 35  ALKPHOS 49  BILITOT 1.1  PROT 6.7  ALBUMIN 3.7   No results for input(s): LIPASE, AMYLASE in the last 168 hours. No results for input(s): AMMONIA in the last 168 hours. Coagulation Profile: No results for input(s): INR, PROTIME in the last 168 hours. Cardiac Enzymes: No results for input(s): CKTOTAL, CKMB, CKMBINDEX, TROPONINI in the last 168 hours. BNP (last 3 results) No results for input(s): PROBNP in the last 8760 hours. HbA1C: No results for input(s): HGBA1C in the last 72 hours. CBG:  Recent Labs Lab 08/16/16 0731 08/16/16 1217 08/16/16 1659 08/16/16 2202 08/17/16 0756  GLUCAP 152* 169* 154* 154* 117*   Lipid Profile: No results for input(s): CHOL, HDL, LDLCALC, TRIG, CHOLHDL, LDLDIRECT in the last 72 hours. Thyroid Function Tests: No results for input(s): TSH, T4TOTAL, FREET4, T3FREE, THYROIDAB in the last  72 hours. Anemia Panel: No results for input(s): VITAMINB12, FOLATE, FERRITIN, TIBC, IRON, RETICCTPCT in the last 72 hours. Sepsis Labs:  Recent Labs Lab 08/14/16 2121 08/15/16 0059  LATICACIDVEN 2.06* 1.8    Recent Results (from the past 240 hour(s))  Urine culture     Status: Abnormal   Collection Time: 08/14/16  8:10 PM  Result Value Ref Range Status   Specimen Description URINE, CLEAN CATCH  Final   Special Requests NONE  Final   Culture >=100,000 COLONIES/mL ENTEROCOCCUS FAECALIS (A)  Final   Report Status 08/17/2016 FINAL  Final   Organism ID, Bacteria ENTEROCOCCUS FAECALIS (A)  Final      Susceptibility   Enterococcus faecalis - MIC*    AMPICILLIN <=2 SENSITIVE Sensitive     LEVOFLOXACIN 1 SENSITIVE Sensitive     NITROFURANTOIN <=16 SENSITIVE Sensitive     VANCOMYCIN 2 SENSITIVE Sensitive     * >=100,000 COLONIES/mL ENTEROCOCCUS FAECALIS  Blood Culture (routine x 2)     Status: Abnormal   Collection Time: 08/14/16  8:33 PM  Result Value Ref Range Status   Specimen Description BLOOD RIGHT ANTECUBITAL  Final   Special  Requests   Final    BOTTLES DRAWN AEROBIC AND ANAEROBIC Blood Culture adequate volume   Culture  Setup Time   Final    GRAM POSITIVE COCCI IN CHAINS IN BOTH AEROBIC AND ANAEROBIC BOTTLES CRITICAL VALUE NOTED.  VALUE IS CONSISTENT WITH PREVIOUSLY REPORTED AND CALLED VALUE. Performed at [facility], [address] [place], [place] [address]    Culture ENTEROCOCCUS FAECALIS (A)  Final   Report Status 08/17/2016 FINAL  Final   Organism ID, Bacteria ENTEROCOCCUS FAECALIS  Final      Susceptibility   Enterococcus faecalis - MIC*    AMPICILLIN <=2 SENSITIVE Sensitive     VANCOMYCIN 2 SENSITIVE Sensitive     GENTAMICIN SYNERGY SENSITIVE Sensitive     * ENTEROCOCCUS FAECALIS  Blood Culture (routine x 2)     Status: Abnormal   Collection Time: 08/14/16  9:04 PM  Result Value Ref Range Status   Specimen Description BLOOD LEFT FOREARM  Final    Special Requests   Final    BOTTLES DRAWN AEROBIC AND ANAEROBIC Blood Culture adequate volume   Culture  Setup Time   Final    GRAM POSITIVE COCCI IN CHAINS IN BOTH AEROBIC AND ANAEROBIC BOTTLES CRITICAL RESULT CALLED TO, READ BACK BY AND VERIFIED WITH: Azzie GlatterJ. Legge Pharm.D. 15:50 08/15/16 ([email or web address])    Culture (A)  Final    ENTEROCOCCUS FAECALIS SUSCEPTIBILITIES PERFORMED ON PREVIOUS CULTURE WITHIN THE LAST 5 DAYS. Performed at [facility], [address] [place], [place] [address]    Report Status 08/17/2016 FINAL  Final  Blood Culture ID Panel (Reflexed)     Status: Abnormal   Collection Time: 08/14/16  9:04 PM  Result Value Ref Range Status   Enterococcus species DETECTED (A) NOT DETECTED Final    Comment: CRITICAL RESULT CALLED TO, READ BACK BY AND VERIFIED WITH: Mardee PostinJ. Legge Pharm.D. 15:50 08/15/16 ([email or web address])    Vancomycin resistance NOT DETECTED NOT DETECTED Final   Listeria monocytogenes NOT DETECTED NOT DETECTED Final   Staphylococcus species NOT DETECTED NOT DETECTED Final   Staphylococcus aureus NOT DETECTED NOT DETECTED Final   Streptococcus species NOT DETECTED NOT DETECTED Final   Streptococcus agalactiae NOT DETECTED NOT DETECTED Final   Streptococcus pneumoniae NOT DETECTED NOT DETECTED Final   Streptococcus pyogenes NOT DETECTED NOT DETECTED Final   Acinetobacter baumannii NOT DETECTED NOT DETECTED Final   Enterobacteriaceae species NOT DETECTED NOT DETECTED Final   Enterobacter cloacae complex NOT DETECTED NOT DETECTED Final   Escherichia coli NOT DETECTED NOT DETECTED Final   Klebsiella oxytoca NOT DETECTED NOT DETECTED Final   Klebsiella pneumoniae NOT DETECTED NOT DETECTED Final   Proteus species NOT DETECTED NOT DETECTED Final   Serratia marcescens NOT DETECTED NOT DETECTED Final   Haemophilus influenzae NOT DETECTED NOT DETECTED Final   Neisseria meningitidis NOT DETECTED NOT DETECTED Final   Pseudomonas aeruginosa NOT DETECTED NOT DETECTED Final   Candida  albicans NOT DETECTED NOT DETECTED Final   Candida glabrata NOT DETECTED NOT DETECTED Final   Candida krusei NOT DETECTED NOT DETECTED Final   Candida parapsilosis NOT DETECTED NOT DETECTED Final   Candida tropicalis NOT DETECTED NOT DETECTED Final    Comment: Performed at [facility], [address] [place], [place] [address]         Radiology Studies: Ct Abdomen Pelvis Wo Contrast  Result Date: 08/15/2016 CLINICAL DATA:  NIDDM, HTN, left renal stone status post lithotripsy, ureteral stent placement on 07/18/2016 and recent admission  from 07/22/2016 to 07/25/2016 for UTI secondary to enterococcus and acute kidney injury status was discharged on oral Augmentin which patient has completed and patient had stent removal last Thursday. Patient presented with fever and dysuria and suprapubic pain and was found to have a UTI with tachycardia and increased lactate and leukocytosis. Patient was admitted on intravenous antibiotics. EXAM: CT ABDOMEN AND PELVIS WITHOUT CONTRAST TECHNIQUE: Multidetector CT imaging of the abdomen and pelvis was performed following the standard protocol without IV contrast. COMPARISON:  07/11/2016 FINDINGS: Lower chest: Scattered coronary calcifications. Dependent atelectasis posteriorly in the lung bases left greater than right as before. Hepatobiliary: Fatty liver without focal lesion. Innumerable small partially calcified stones fill the nondilated gallbladder. No biliary ductal dilatation. Pancreas: Unremarkable. No pancreatic ductal dilatation or surrounding inflammatory changes. Spleen: Normal in size without focal abnormality. Adrenals/Urinary Tract: Normal adrenals. Interval removal of left percutaneous nephrostomy catheter and ureteral stent. Bilateral nephrolithiasis, largest stone on the left in the lower pole 6 mm, on the right in the lower pole 7 mm. Mild left pelvicaliectasis and ureterectasis without evidence of obstructing calculus. Some increase in streaky  inflammatory/edematous changes in the left perinephric fascia. Urinary bladder physiologically distended, with 12 mm calculus in its dependent aspect. Stomach/Bowel: Stomach and small bowel are nondilated. Appendix not identified. The colon is nondistended, unremarkable. Vascular/Lymphatic: Scattered aortoiliac arterial calcifications without aneurysm. No abdominal or pelvic adenopathy. Bilateral pelvic phleboliths. Reproductive: Moderate prostatic enlargement with central coarse calcifications. Other: No ascites.  No free air. Musculoskeletal: No acute or significant osseous findings. IMPRESSION: 1. Left pelvicaliectasis and mild ureterectasis without evidence of obstructing calculus, post removal of nephrostomy and ureteral stent. There is also some increase in left perinephric inflammatory/edematous changes. 2. Bilateral nephrolithiasis as above. 3. 12 mm calculus in the urinary bladder. 4. Coronary and Aortic Atherosclerosis (ICD10-170.0) 5. Fatty liver 6. Cholelithiasis Electronically Signed   By: Corlis Leak M.D.   On: 08/15/2016 12:19        Scheduled Meds: . aspirin EC  81 mg Oral Daily  . atorvastatin  40 mg Oral Daily  . enoxaparin (LOVENOX) injection  40 mg Subcutaneous QHS  . ezetimibe  10 mg Oral Daily  . insulin aspart  0-5 Units Subcutaneous QHS  . insulin aspart  0-9 Units Subcutaneous TID WC  . phenazopyridine  100 mg Oral TID WC   Continuous Infusions: . sodium chloride 75 mL/hr at 08/16/16 1038  . ampicillin (OMNIPEN) IV Stopped (08/17/16 3244)     LOS: 3 days        Glade Lloyd, MD [facility] Pager [phone]  If 7PM-7AM, please contact night-coverage [email or web address] Password [email or web address] 08/17/2016, 11:24 AM

## 2016-08-18 LAB — CBC WITH DIFFERENTIAL/PLATELET
Basophils Absolute: 0 10*3/uL (ref 0.0–0.1)
Basophils Relative: 0 %
EOS ABS: 0.1 10*3/uL (ref 0.0–0.7)
EOS PCT: 1 %
HCT: 31.5 % — ABNORMAL LOW (ref 39.0–52.0)
Hemoglobin: 10.7 g/dL — ABNORMAL LOW (ref 13.0–17.0)
LYMPHS ABS: 1.5 10*3/uL (ref 0.7–4.0)
Lymphocytes Relative: 18 %
MCH: 32.8 pg (ref 26.0–34.0)
MCHC: 34 g/dL (ref 30.0–36.0)
MCV: 96.6 fL (ref 78.0–100.0)
Monocytes Absolute: 0.9 10*3/uL (ref 0.1–1.0)
Monocytes Relative: 11 %
Neutro Abs: 5.6 10*3/uL (ref 1.7–7.7)
Neutrophils Relative %: 70 %
PLATELETS: 132 10*3/uL — AB (ref 150–400)
RBC: 3.26 MIL/uL — AB (ref 4.22–5.81)
RDW: 13.7 % (ref 11.5–15.5)
WBC: 8.1 10*3/uL (ref 4.0–10.5)

## 2016-08-18 LAB — C-REACTIVE PROTEIN: CRP: 16.4 mg/dL — ABNORMAL HIGH (ref <1.0)

## 2016-08-18 LAB — BASIC METABOLIC PANEL
Anion gap: 9 (ref 5–15)
BUN: 16 mg/dL (ref 6–20)
CO2: 24 mmol/L (ref 22–32)
CREATININE: 1.2 mg/dL (ref 0.61–1.24)
Calcium: 8.6 mg/dL — ABNORMAL LOW (ref 8.9–10.3)
Chloride: 109 mmol/L (ref 101–111)
GFR calc Af Amer: >60 mL/min (ref >60)
Glucose, Bld: 119 mg/dL — ABNORMAL HIGH (ref 65–99)
POTASSIUM: 3.5 mmol/L (ref 3.5–5.1)
SODIUM: 142 mmol/L (ref 135–145)

## 2016-08-18 LAB — GLUCOSE, CAPILLARY
GLUCOSE-CAPILLARY: 188 mg/dL — AB (ref 65–99)
Glucose-Capillary: 105 mg/dL — ABNORMAL HIGH (ref 65–99)
Glucose-Capillary: 122 mg/dL — ABNORMAL HIGH (ref 65–99)
Glucose-Capillary: 154 mg/dL — ABNORMAL HIGH (ref 65–99)

## 2016-08-18 LAB — MAGNESIUM: MAGNESIUM: 1.7 mg/dL (ref 1.7–2.4)

## 2016-08-18 NOTE — Progress Notes (Signed)
    [facility]    Date of Admission:  08/14/2016   Total days of antibiotics 5        Day 4 ampicillin           ID: Keith RainwaterJames S Ortiz is a 66 y.o. male with enterococcal bacteremia Principal Problem:   Sepsis (HCC) Active Problems:   Renal calculus, left   Essential hypertension   AKI (acute kidney injury) (HCC)   Type 2 diabetes mellitus without complication, without long-term current use of insulin (HCC)    Subjective: Afebrile, no longer having nightsweats, no dysuria  Medications:  . aspirin EC  81 mg Oral Daily  . atorvastatin  40 mg Oral Daily  . enoxaparin (LOVENOX) injection  40 mg Subcutaneous QHS  . ezetimibe  10 mg Oral Daily  . insulin aspart  0-5 Units Subcutaneous QHS  . insulin aspart  0-9 Units Subcutaneous TID WC  . phenazopyridine  100 mg Oral TID WC    Objective: Vital signs in last 24 hours: Temp:  [98.1 F (36.7 C)-99 F (37.2 C)] 98.1 F (36.7 C) (08/05 0648) Pulse Rate:  [78-93] 78 (08/05 0648) Resp:  [16-18] 16 (08/05 0648) BP: (132-136)/(75-84) 136/84 (08/05 0648) SpO2:  [96 %-100 %] 100 % (08/05 16100648) Physical Exam  Constitutional: He is oriented to person, place, and time. He appears well-developed and well-nourished. No distress.  HENT:  Mouth/Throat: Oropharynx is clear and moist. No oropharyngeal exudate.  Cardiovascular: Normal rate, regular rhythm and normal heart sounds. Exam reveals no gallop and no friction rub.  No murmur heard.  Pulmonary/Chest: Effort normal and breath sounds normal. No respiratory distress. He has no wheezes.  Abdominal: Soft. Bowel sounds are normal. He exhibits no distension. There is no tenderness.  Lymphadenopathy:  He has no cervical adenopathy.  Neurological: He is alert and oriented to person, place, and time.  Skin: Skin is warm and dry. No rash noted. No erythema.  Psychiatric: He has a normal mood and affect. His behavior is normal.    Lab Results  Recent Labs   08/17/16 0553 08/18/16 0509  WBC 11.3* 8.1  HGB 10.1* 10.7*  HCT 29.3* 31.5*  NA 140 142  K 3.7 3.5  CL 108 109  CO2 24 24  BUN 14 16  CREATININE 1.21 1.20   No results found for: ESRSEDRATE, POCTSEDRATE C-Reactive Protein  Recent Labs  08/17/16 0553 08/18/16 0509  CRP 26.7* 16.4*    Microbiology: 8/3 blood cx ngtd Studies/Results: No results found.   Assessment/Plan: Complicated enterococcal bacteremia from urinary source with recent urologic instrumentation = plan to treat with ampicillin IV for minimum of 2 weeks using 8/3 as day 1. Can place picc line tomorrow.  Recommend to get TEE to rule out endocarditis  If TEE positive for vegetation, we will add ceftriaxone plus extend abtx to 6 wk  Kuron Docken, [facility] Cell: [phone] Pager: [phone]  08/18/2016, 9:29 AM

## 2016-08-18 NOTE — Progress Notes (Signed)
Patient ID: Keith Ortiz, male   DOB: [birth year], 66 y.o.   MRN: [id number]  PROGRESS NOTE    AMERICUS PERKEY  NWG:[id number] DOB: September 24, 1950 DOA: 08/14/2016 PCP: Iona Hansen, NP   Brief Narrative:  66 year old male with history of NIDDM, HTN, left renal stone status post lithotripsy,ureteral stent placement on 07/18/2016 and recent admission from 07/22/2016 to 07/25/2016 for UTI secondary to enterococcus and acute kidney injury status was discharged on oral Augmentin which patient has completed and patient had stent removal last Thursday. Patient presented with fever and dysuria and suprapubic pain and was found to have a UTI with tachycardia and increased lactate and leukocytosis. Patient was admitted on intravenous antibiotics.Urology was consulted. Blood culture grew enterococcus faecalis. ID has been consulted.  Assessment & Plan:   Principal Problem:   Sepsis (HCC) Active Problems:   Renal calculus, left   Essential hypertension   AKI (acute kidney injury) (HCC)   Type 2 diabetes mellitus without complication, without long-term current use of insulin (HCC)   Sepsis: - Secondary to urinary tract infection. Blood cultures growing enterococcus.  Continue ampicillin as per ID recommendations   Probable complicated urinary tract infection/Acute pyelonephritisin a patient with history of recent ureteral stent removal and recent treatment for enterococcal UTI - CAT scan of the abdomen did not show any obstructing stones. Urology consult appreciated. Follow further recommendations from urology  - Continue ampicillin. Follow recommendations from ID  Enterococcus fecalis bacteremia - Continue ampicillin. Follow ID recommendations. repeat cultures done on 08/16/16 are negative so far. - 2-D echo done on 08/15/16 was suspicious for non-oscillating density on mitral valve.  Spoke to on call cardiologist Dr. Duke Salvia on 08/17/2016 who will arrange for TEE probably on  Monday/Tuesday.  Leukocytosis - Probably from sepsis. Continue antibiotics.  -Resolved, Repeat a.m. labs  Acute kidney injury: From sepsis/pyelo.  - Creatinine improving. Repeat a.m. labs  Diabetes: Normoglycemic at admission -Hold metformin -Low dose SSI  Hypertension: -Blood pressure stable. Antihypertensives on hold for now -Continue aspirin, statin, Zetia  Thrombocytopenia: - Probably from sepsis. Repeat a.m. Labs  Hypomagnesemia - Improved   DVT prophylaxis:Lovenox Code Status:FULL Family Communication:none at bedside  Disposition Plan:home in 2-4 days Consulants:Urology; ID; cardiology for TEE Procedures: 2-D echo on 08/15/16 Impressions:  - Normal LV systolic function; mild diastolic dysfunction; calcified aortic valve with mild AS (mean gradient 10 mmHg); thickened MV with nonoscillating density on anterior leaflet likely related to calcification; mild MR; trace TR with mildly elevated pulmonary pressure. Consider TEE in 1-2 weeks to reassess MV density.  Antimicrobials: Zosyn from 08/14/2016 - 08/15/16 Ampicillin from 08/15/16 onwards Subjective: Patient seen and examined at bedside. He denies any overnight fever, nausea or vomiting.  Objective: Vitals:   08/17/16 0644 08/17/16 1500 08/17/16 2211 08/18/16 0648  BP: 123/82 136/83 132/75 136/84  Pulse: 80 84 93 78  Resp: 17 18 18 16   Temp: 98.4 F (36.9 C) 99 F (37.2 C) 99 F (37.2 C) 98.1 F (36.7 C)  TempSrc: Oral Oral Oral Oral  SpO2: 98% 96% 97% 100%  Weight:      Height:        Intake/Output Summary (Last 24 hours) at 08/18/16 1120 Last data filed at 08/18/16 0649  Gross per 24 hour  Intake              480 ml  Output             2850 ml  Net            -  2370 ml   Filed Weights   08/15/16 0522  Weight: 99 kg (218 lb 4.1 oz)    Examination:  General exam: Appears calm and comfortable  Respiratory system: Bilateral decreased breath sound at  bases Cardiovascular system: S1 & S2 heard, Rate controlled. No murmurs  Gastrointestinal system: Abdomen is nondistended, soft and nontender. Normal bowel sounds heard. Extremities: No cyanosis, clubbing, edema    Data Reviewed: I have personally reviewed following labs and imaging studies  CBC:  Recent Labs Lab 08/14/16 2028 08/15/16 0059 08/16/16 0641 08/17/16 0553 08/18/16 0509  WBC 16.2* 17.6* 17.1* 11.3* 8.1  NEUTROABS 14.7*  --  14.9* 8.7* 5.6  HGB 12.3* 11.6* 11.1* 10.1* 10.7*  HCT 34.9* 32.7* 32.2* 29.3* 31.5*  MCV 95.1 95.6 95.5 96.7 96.6  PLT 150 139* 121* 116* 132*   Basic Metabolic Panel:  Recent Labs Lab 08/14/16 2028 08/15/16 0059 08/16/16 0641 08/17/16 0553 08/18/16 0509  NA 135 137 137 140 142  K 3.7 4.0 3.7 3.7 3.5  CL 103 108 104 108 109  CO2 21* 20* 25 24 24   GLUCOSE 164* 143* 153* 117* 119*  BUN 28* 24* 16 14 16   CREATININE 1.67* 1.47* 1.48* 1.21 1.20  CALCIUM 9.4 8.5* 8.6* 8.5* 8.6*  MG  --   --  1.6* 1.9 1.7   GFR: Estimated Creatinine Clearance: 76.8 mL/min (by C-G formula based on SCr of 1.2 mg/dL). Liver Function Tests:  Recent Labs Lab 08/14/16 2028  AST 28  ALT 35  ALKPHOS 49  BILITOT 1.1  PROT 6.7  ALBUMIN 3.7   No results for input(s): LIPASE, AMYLASE in the last 168 hours. No results for input(s): AMMONIA in the last 168 hours. Coagulation Profile: No results for input(s): INR, PROTIME in the last 168 hours. Cardiac Enzymes: No results for input(s): CKTOTAL, CKMB, CKMBINDEX, TROPONINI in the last 168 hours. BNP (last 3 results) No results for input(s): PROBNP in the last 8760 hours. HbA1C: No results for input(s): HGBA1C in the last 72 hours. CBG:  Recent Labs Lab 08/17/16 0756 08/17/16 1203 08/17/16 1711 08/17/16 2215 08/18/16 0758  GLUCAP 117* 121* 144* 153* 105*   Lipid Profile: No results for input(s): CHOL, HDL, LDLCALC, TRIG, CHOLHDL, LDLDIRECT in the last 72 hours. Thyroid Function Tests: No results  for input(s): TSH, T4TOTAL, FREET4, T3FREE, THYROIDAB in the last 72 hours. Anemia Panel: No results for input(s): VITAMINB12, FOLATE, FERRITIN, TIBC, IRON, RETICCTPCT in the last 72 hours. Sepsis Labs:  Recent Labs Lab 08/14/16 2121 08/15/16 0059  LATICACIDVEN 2.06* 1.8    Recent Results (from the past 240 hour(s))  Urine culture     Status: Abnormal   Collection Time: 08/14/16  8:10 PM  Result Value Ref Range Status   Specimen Description URINE, CLEAN CATCH  Final   Special Requests NONE  Final   Culture >=100,000 COLONIES/mL ENTEROCOCCUS FAECALIS (A)  Final   Report Status 08/17/2016 FINAL  Final   Organism ID, Bacteria ENTEROCOCCUS FAECALIS (A)  Final      Susceptibility   Enterococcus faecalis - MIC*    AMPICILLIN <=2 SENSITIVE Sensitive     LEVOFLOXACIN 1 SENSITIVE Sensitive     NITROFURANTOIN <=16 SENSITIVE Sensitive     VANCOMYCIN 2 SENSITIVE Sensitive     * >=100,000 COLONIES/mL ENTEROCOCCUS FAECALIS  Blood Culture (routine x 2)     Status: Abnormal   Collection Time: 08/14/16  8:33 PM  Result Value Ref Range Status   Specimen Description BLOOD RIGHT ANTECUBITAL  Final   Special Requests   Final    BOTTLES DRAWN AEROBIC AND ANAEROBIC Blood Culture adequate volume   Culture  Setup Time   Final    GRAM POSITIVE COCCI IN CHAINS IN BOTH AEROBIC AND ANAEROBIC BOTTLES CRITICAL VALUE NOTED.  VALUE IS CONSISTENT WITH PREVIOUSLY REPORTED AND CALLED VALUE. Performed at [facility], [address] [place], [place] [address]    Culture ENTEROCOCCUS FAECALIS (A)  Final   Report Status 08/17/2016 FINAL  Final   Organism ID, Bacteria ENTEROCOCCUS FAECALIS  Final      Susceptibility   Enterococcus faecalis - MIC*    AMPICILLIN <=2 SENSITIVE Sensitive     VANCOMYCIN 2 SENSITIVE Sensitive     GENTAMICIN SYNERGY SENSITIVE Sensitive     * ENTEROCOCCUS FAECALIS  Blood Culture (routine x 2)     Status: Abnormal   Collection Time: 08/14/16  9:04 PM  Result Value Ref Range  Status   Specimen Description BLOOD LEFT FOREARM  Final   Special Requests   Final    BOTTLES DRAWN AEROBIC AND ANAEROBIC Blood Culture adequate volume   Culture  Setup Time   Final    GRAM POSITIVE COCCI IN CHAINS IN BOTH AEROBIC AND ANAEROBIC BOTTLES CRITICAL RESULT CALLED TO, READ BACK BY AND VERIFIED WITH: Azzie GlatterJ. Legge Pharm.D. 15:50 08/15/16 ([email or web address])    Culture (A)  Final    ENTEROCOCCUS FAECALIS SUSCEPTIBILITIES PERFORMED ON PREVIOUS CULTURE WITHIN THE LAST 5 DAYS. Performed at [facility], [address] [place], [place] [address]    Report Status 08/17/2016 FINAL  Final  Blood Culture ID Panel (Reflexed)     Status: Abnormal   Collection Time: 08/14/16  9:04 PM  Result Value Ref Range Status   Enterococcus species DETECTED (A) NOT DETECTED Final    Comment: CRITICAL RESULT CALLED TO, READ BACK BY AND VERIFIED WITH: Mardee PostinJ. Legge Pharm.D. 15:50 08/15/16 ([email or web address])    Vancomycin resistance NOT DETECTED NOT DETECTED Final   Listeria monocytogenes NOT DETECTED NOT DETECTED Final   Staphylococcus species NOT DETECTED NOT DETECTED Final   Staphylococcus aureus NOT DETECTED NOT DETECTED Final   Streptococcus species NOT DETECTED NOT DETECTED Final   Streptococcus agalactiae NOT DETECTED NOT DETECTED Final   Streptococcus pneumoniae NOT DETECTED NOT DETECTED Final   Streptococcus pyogenes NOT DETECTED NOT DETECTED Final   Acinetobacter baumannii NOT DETECTED NOT DETECTED Final   Enterobacteriaceae species NOT DETECTED NOT DETECTED Final   Enterobacter cloacae complex NOT DETECTED NOT DETECTED Final   Escherichia coli NOT DETECTED NOT DETECTED Final   Klebsiella oxytoca NOT DETECTED NOT DETECTED Final   Klebsiella pneumoniae NOT DETECTED NOT DETECTED Final   Proteus species NOT DETECTED NOT DETECTED Final   Serratia marcescens NOT DETECTED NOT DETECTED Final   Haemophilus influenzae NOT DETECTED NOT DETECTED Final   Neisseria meningitidis NOT DETECTED NOT DETECTED Final    Pseudomonas aeruginosa NOT DETECTED NOT DETECTED Final   Candida albicans NOT DETECTED NOT DETECTED Final   Candida glabrata NOT DETECTED NOT DETECTED Final   Candida krusei NOT DETECTED NOT DETECTED Final   Candida parapsilosis NOT DETECTED NOT DETECTED Final   Candida tropicalis NOT DETECTED NOT DETECTED Final    Comment: Performed at [facility], [address] [place], [place] [address]  Culture, blood (routine x 2)     Status: None (Preliminary result)   Collection Time: 08/16/16  6:41 AM  Result Value Ref Range Status   Specimen Description BLOOD BLOOD  LEFT ARM  Final   Special Requests   Final    BOTTLES DRAWN AEROBIC AND ANAEROBIC Blood Culture adequate volume   Culture   Final    NO GROWTH 1 DAY Performed at [facility], [address] [place], [place] [address]    Report Status PENDING  Incomplete  Culture, blood (routine x 2)     Status: None (Preliminary result)   Collection Time: 08/16/16  6:41 AM  Result Value Ref Range Status   Specimen Description BLOOD BLOOD RIGHT HAND  Final   Special Requests IN PEDIATRIC BOTTLE Blood Culture adequate volume  Final   Culture   Final    NO GROWTH 1 DAY Performed at [facility], [address] [place], [place] [address]    Report Status PENDING  Incomplete         Radiology Studies: No results found.      Scheduled Meds: . aspirin EC  81 mg Oral Daily  . atorvastatin  40 mg Oral Daily  . enoxaparin (LOVENOX) injection  40 mg Subcutaneous QHS  . ezetimibe  10 mg Oral Daily  . insulin aspart  0-5 Units Subcutaneous QHS  . insulin aspart  0-9 Units Subcutaneous TID WC  . phenazopyridine  100 mg Oral TID WC   Continuous Infusions: . sodium chloride 75 mL/hr at 08/18/16 0837  . ampicillin (OMNIPEN) IV Stopped (08/18/16 0981)     LOS: 4 days        Glade Lloyd, MD [facility] Pager [phone]  If 7PM-7AM, please contact night-coverage [email or web address] Password  [email or web address] 08/18/2016, 11:20 AM

## 2016-08-19 ENCOUNTER — Inpatient Hospital Stay (HOSPITAL_COMMUNITY): Payer: No Typology Code available for payment source

## 2016-08-19 ENCOUNTER — Encounter (HOSPITAL_COMMUNITY): Payer: Self-pay | Admitting: Cardiology

## 2016-08-19 DIAGNOSIS — M79672 Pain in left foot: Secondary | ICD-10-CM

## 2016-08-19 LAB — GLUCOSE, CAPILLARY
GLUCOSE-CAPILLARY: 121 mg/dL — AB (ref 65–99)
GLUCOSE-CAPILLARY: 177 mg/dL — AB (ref 65–99)
Glucose-Capillary: 113 mg/dL — ABNORMAL HIGH (ref 65–99)
Glucose-Capillary: 152 mg/dL — ABNORMAL HIGH (ref 65–99)

## 2016-08-19 LAB — CBC WITH DIFFERENTIAL/PLATELET
Basophils Absolute: 0 10*3/uL (ref 0.0–0.1)
Basophils Relative: 0 %
Eosinophils Absolute: 0.1 10*3/uL (ref 0.0–0.7)
Eosinophils Relative: 1 %
HCT: 29.8 % — ABNORMAL LOW (ref 39.0–52.0)
Hemoglobin: 10.2 g/dL — ABNORMAL LOW (ref 13.0–17.0)
Lymphocytes Relative: 19 %
Lymphs Abs: 1.6 10*3/uL (ref 0.7–4.0)
MCH: 32.6 pg (ref 26.0–34.0)
MCHC: 34.2 g/dL (ref 30.0–36.0)
MCV: 95.2 fL (ref 78.0–100.0)
Monocytes Absolute: 1 10*3/uL (ref 0.1–1.0)
Monocytes Relative: 12 %
Neutro Abs: 5.9 10*3/uL (ref 1.7–7.7)
Neutrophils Relative %: 68 %
Platelets: 155 10*3/uL (ref 150–400)
RBC: 3.13 MIL/uL — ABNORMAL LOW (ref 4.22–5.81)
RDW: 13.6 % (ref 11.5–15.5)
WBC: 8.6 10*3/uL (ref 4.0–10.5)

## 2016-08-19 LAB — BASIC METABOLIC PANEL
Anion gap: 8 (ref 5–15)
BUN: 13 mg/dL (ref 6–20)
CO2: 24 mmol/L (ref 22–32)
Calcium: 8.6 mg/dL — ABNORMAL LOW (ref 8.9–10.3)
Chloride: 110 mmol/L (ref 101–111)
Creatinine, Ser: 1.11 mg/dL (ref 0.61–1.24)
GFR calc Af Amer: >60 mL/min (ref >60)
GFR calc non Af Amer: >60 mL/min (ref >60)
Glucose, Bld: 111 mg/dL — ABNORMAL HIGH (ref 65–99)
Potassium: 3.6 mmol/L (ref 3.5–5.1)
Sodium: 142 mmol/L (ref 135–145)

## 2016-08-19 LAB — MAGNESIUM: Magnesium: 1.7 mg/dL (ref 1.7–2.4)

## 2016-08-19 LAB — C-REACTIVE PROTEIN: CRP: 7.3 mg/dL — ABNORMAL HIGH (ref <1.0)

## 2016-08-19 MED ORDER — SODIUM CHLORIDE 0.9% FLUSH
10.0000 mL | INTRAVENOUS | Status: DC | PRN
  Administered 2016-08-20 (×2): 10 mL
  Filled 2016-08-19 (×2): qty 40

## 2016-08-19 MED ORDER — INDOMETHACIN 25 MG PO CAPS
50.0000 mg | ORAL_CAPSULE | Freq: Three times a day (TID) | ORAL | Status: DC
  Administered 2016-08-19 – 2016-08-20 (×3): 50 mg via ORAL
  Filled 2016-08-19 (×4): qty 2

## 2016-08-19 NOTE — Progress Notes (Signed)
Patient ID: Keith Ortiz, male   DOB: [birth year], 66 y.o.   MRN: [id number]  PROGRESS NOTE    Keith Ortiz  [name]:[id number] DOB: 04-Jul-1950 DOA: 08/14/2016 PCP: Iona Hansen, NP   Brief Narrative:  66 year old male with history of NIDDM, HTN, left renal stone status post lithotripsy,ureteral stent placement on 07/18/2016 and recent admission from 07/22/2016 to 07/25/2016 for UTI secondary to enterococcus and acute kidney injury status was discharged on oral Augmentin which patient has completed and patient had stent removal last Thursday. Patient presented with fever and dysuria and suprapubic pain and was found to have a UTI with tachycardia and increased lactate and leukocytosis. Patient was admitted on intravenous antibiotics.Urology was consulted. Blood culture grew enterococcus faecalis. ID has been consulted.  Assessment & Plan:   Principal Problem:   Sepsis (HCC) Active Problems:   Renal calculus, left   Essential hypertension   AKI (acute kidney injury) (HCC)   Type 2 diabetes mellitus without complication, without long-term current use of insulin (HCC)   Sepsis: - Resolved  Probable complicated urinary tract infection/Acute pyelonephritisin a patient with history of recent ureteral stent removal and recent treatment for enterococcal UTI - CAT scan of the abdomen did not show any obstructing stones. Urology consult appreciated. Outpatient follow up with Urology - Continue ampicillin. Follow recommendations from ID  Enterococcus fecalisbacteremia - Continue ampicillin. Follow ID recommendations. repeat cultures done on 08/16/16 are negative so far. - 2-D echo done on 08/15/16 was suspicious for non-oscillating density on mitral valve.  Spoke to Rhonda/Cardiology PA who states that she will try and arrange TEE probably for tomorrow   Left foot pain - Questionable cause. - X-ray of the left foot. Uric acid level for tomorrow - Foot elevation - Use NSAIDs with  caution  Leukocytosis -Resolved, Repeat a.m. labs  Acute kidney injury: From sepsis/pyelo.  - Creatinine improving. Repeat a.m. labs  Diabetes: Normoglycemic at admission -Hold metformin -Low dose SSI  Hypertension: -Blood pressure stable. Antihypertensives on hold for now -Continue aspirin, statin, Zetia  Thrombocytopenia: - Probably from sepsis. - Resolved  Hypomagnesemia - Improved   DVT prophylaxis:Lovenox Code Status:FULL Family Communication:none at bedside  Disposition Plan:home in 1-3days Consulants:Urology; ID; cardiology for TEE Procedures: 2-D echo on 08/15/16 Impressions:  - Normal LV systolic function; mild diastolic dysfunction; calcified aortic valve with mild AS (mean gradient 10 mmHg); thickened MV with nonoscillating density on anterior leaflet likely related to calcification; mild MR; trace TR with mildly elevated pulmonary pressure. Consider TEE in 1-2 weeks to reassess MV density.  Antimicrobials: Zosyn from 08/14/2016 - 08/15/16 Ampicillin from 08/15/16 onwards Subjective: Patient seen and examined at bedside. He states that his left foot has been hurting since yesterday. He denies any trauma. No overnight fever, nausea or vomiting.  Objective: Vitals:   08/18/16 1335 08/18/16 1338 08/18/16 2019 08/19/16 0525  BP: 134/87  (!) 147/88 135/70  Pulse: 64  76 68  Resp: 17  18 18   Temp:  98.9 F (37.2 C) 99 F (37.2 C) 97.8 F (36.6 C)  TempSrc:  Oral Oral Oral  SpO2: 99%  99% 98%  Weight:      Height:        Intake/Output Summary (Last 24 hours) at 08/19/16 1051 Last data filed at 08/19/16 0842  Gross per 24 hour  Intake                0 ml  Output  2450 ml  Net            -2450 ml   Filed Weights   08/15/16 0522  Weight: 99 kg (218 lb 4.1 oz)    Examination:  General exam: Appears calm and comfortable  Respiratory system: Bilateral decreased breath sound at bases Cardiovascular  system: S1 & S2 heard, Rate controlled  Gastrointestinal system: Abdomen is nondistended, soft and nontender. Normal bowel sounds heard. Extremities: No cyanosis, clubbing; Mild left foot swelling and tenderness but no erythema    Data Reviewed: I have personally reviewed following labs and imaging studies  CBC:  Recent Labs Lab 08/14/16 2028 08/15/16 0059 08/16/16 0641 08/17/16 0553 08/18/16 0509 08/19/16 0544  WBC 16.2* 17.6* 17.1* 11.3* 8.1 8.6  NEUTROABS 14.7*  --  14.9* 8.7* 5.6 5.9  HGB 12.3* 11.6* 11.1* 10.1* 10.7* 10.2*  HCT 34.9* 32.7* 32.2* 29.3* 31.5* 29.8*  MCV 95.1 95.6 95.5 96.7 96.6 95.2  PLT 150 139* 121* 116* 132* 155   Basic Metabolic Panel:  Recent Labs Lab 08/15/16 0059 08/16/16 0641 08/17/16 0553 08/18/16 0509 08/19/16 0544  NA 137 137 140 142 142  K 4.0 3.7 3.7 3.5 3.6  CL 108 104 108 109 110  CO2 20* 25 24 24 24   GLUCOSE 143* 153* 117* 119* 111*  BUN 24* 16 14 16 13   CREATININE 1.47* 1.48* 1.21 1.20 1.11  CALCIUM 8.5* 8.6* 8.5* 8.6* 8.6*  MG  --  1.6* 1.9 1.7 1.7   GFR: Estimated Creatinine Clearance: 83.1 mL/min (by C-G formula based on SCr of 1.11 mg/dL). Liver Function Tests:  Recent Labs Lab 08/14/16 2028  AST 28  ALT 35  ALKPHOS 49  BILITOT 1.1  PROT 6.7  ALBUMIN 3.7   No results for input(s): LIPASE, AMYLASE in the last 168 hours. No results for input(s): AMMONIA in the last 168 hours. Coagulation Profile: No results for input(s): INR, PROTIME in the last 168 hours. Cardiac Enzymes: No results for input(s): CKTOTAL, CKMB, CKMBINDEX, TROPONINI in the last 168 hours. BNP (last 3 results) No results for input(s): PROBNP in the last 8760 hours. HbA1C: No results for input(s): HGBA1C in the last 72 hours. CBG:  Recent Labs Lab 08/18/16 0758 08/18/16 1207 08/18/16 1659 08/18/16 2017 08/19/16 0752  GLUCAP 105* 122* 154* 188* 121*   Lipid Profile: No results for input(s): CHOL, HDL, LDLCALC, TRIG, CHOLHDL, LDLDIRECT  in the last 72 hours. Thyroid Function Tests: No results for input(s): TSH, T4TOTAL, FREET4, T3FREE, THYROIDAB in the last 72 hours. Anemia Panel: No results for input(s): VITAMINB12, FOLATE, FERRITIN, TIBC, IRON, RETICCTPCT in the last 72 hours. Sepsis Labs:  Recent Labs Lab 08/14/16 2121 08/15/16 0059  LATICACIDVEN 2.06* 1.8    Recent Results (from the past 240 hour(s))  Urine culture     Status: Abnormal   Collection Time: 08/14/16  8:10 PM  Result Value Ref Range Status   Specimen Description URINE, CLEAN CATCH  Final   Special Requests NONE  Final   Culture >=100,000 COLONIES/mL ENTEROCOCCUS FAECALIS (A)  Final   Report Status 08/17/2016 FINAL  Final   Organism ID, Bacteria ENTEROCOCCUS FAECALIS (A)  Final      Susceptibility   Enterococcus faecalis - MIC*    AMPICILLIN <=2 SENSITIVE Sensitive     LEVOFLOXACIN 1 SENSITIVE Sensitive     NITROFURANTOIN <=16 SENSITIVE Sensitive     VANCOMYCIN 2 SENSITIVE Sensitive     * >=100,000 COLONIES/mL ENTEROCOCCUS FAECALIS  Blood Culture (routine x 2)  Status: Abnormal   Collection Time: 08/14/16  8:33 PM  Result Value Ref Range Status   Specimen Description BLOOD RIGHT ANTECUBITAL  Final   Special Requests   Final    BOTTLES DRAWN AEROBIC AND ANAEROBIC Blood Culture adequate volume   Culture  Setup Time   Final    GRAM POSITIVE COCCI IN CHAINS IN BOTH AEROBIC AND ANAEROBIC BOTTLES CRITICAL VALUE NOTED.  VALUE IS CONSISTENT WITH PREVIOUSLY REPORTED AND CALLED VALUE. Performed at [facility], [address] [place], [place] [address]    Culture ENTEROCOCCUS FAECALIS (A)  Final   Report Status 08/17/2016 FINAL  Final   Organism ID, Bacteria ENTEROCOCCUS FAECALIS  Final      Susceptibility   Enterococcus faecalis - MIC*    AMPICILLIN <=2 SENSITIVE Sensitive     VANCOMYCIN 2 SENSITIVE Sensitive     GENTAMICIN SYNERGY SENSITIVE Sensitive     * ENTEROCOCCUS FAECALIS  Blood Culture (routine x 2)     Status: Abnormal    Collection Time: 08/14/16  9:04 PM  Result Value Ref Range Status   Specimen Description BLOOD LEFT FOREARM  Final   Special Requests   Final    BOTTLES DRAWN AEROBIC AND ANAEROBIC Blood Culture adequate volume   Culture  Setup Time   Final    GRAM POSITIVE COCCI IN CHAINS IN BOTH AEROBIC AND ANAEROBIC BOTTLES CRITICAL RESULT CALLED TO, READ BACK BY AND VERIFIED WITH: Azzie Glatter Pharm.D. 15:50 08/15/16 ([email or web address])    Culture (A)  Final    ENTEROCOCCUS FAECALIS SUSCEPTIBILITIES PERFORMED ON PREVIOUS CULTURE WITHIN THE LAST 5 DAYS. Performed at [facility], [address] [place], [place] [address]    Report Status 08/17/2016 FINAL  Final  Blood Culture ID Panel (Reflexed)     Status: Abnormal   Collection Time: 08/14/16  9:04 PM  Result Value Ref Range Status   Enterococcus species DETECTED (A) NOT DETECTED Final    Comment: CRITICAL RESULT CALLED TO, READ BACK BY AND VERIFIED WITH: Mardee Postin.D. 15:50 08/15/16 ([email or web address])    Vancomycin resistance NOT DETECTED NOT DETECTED Final   Listeria monocytogenes NOT DETECTED NOT DETECTED Final   Staphylococcus species NOT DETECTED NOT DETECTED Final   Staphylococcus aureus NOT DETECTED NOT DETECTED Final   Streptococcus species NOT DETECTED NOT DETECTED Final   Streptococcus agalactiae NOT DETECTED NOT DETECTED Final   Streptococcus pneumoniae NOT DETECTED NOT DETECTED Final   Streptococcus pyogenes NOT DETECTED NOT DETECTED Final   Acinetobacter baumannii NOT DETECTED NOT DETECTED Final   Enterobacteriaceae species NOT DETECTED NOT DETECTED Final   Enterobacter cloacae complex NOT DETECTED NOT DETECTED Final   Escherichia coli NOT DETECTED NOT DETECTED Final   Klebsiella oxytoca NOT DETECTED NOT DETECTED Final   Klebsiella pneumoniae NOT DETECTED NOT DETECTED Final   Proteus species NOT DETECTED NOT DETECTED Final   Serratia marcescens NOT DETECTED NOT DETECTED Final   Haemophilus influenzae NOT DETECTED NOT DETECTED Final    Neisseria meningitidis NOT DETECTED NOT DETECTED Final   Pseudomonas aeruginosa NOT DETECTED NOT DETECTED Final   Candida albicans NOT DETECTED NOT DETECTED Final   Candida glabrata NOT DETECTED NOT DETECTED Final   Candida krusei NOT DETECTED NOT DETECTED Final   Candida parapsilosis NOT DETECTED NOT DETECTED Final   Candida tropicalis NOT DETECTED NOT DETECTED Final    Comment: Performed at [facility], [address], [place], [place] [address]  Culture, blood (routine x 2)  Status: None (Preliminary result)   Collection Time: 08/16/16  6:41 AM  Result Value Ref Range Status   Specimen Description BLOOD BLOOD LEFT ARM  Final   Special Requests   Final    BOTTLES DRAWN AEROBIC AND ANAEROBIC Blood Culture adequate volume   Culture   Final    NO GROWTH 3 DAYS Performed at [facility], [address] [place], [place] [address]    Report Status PENDING  Incomplete  Culture, blood (routine x 2)     Status: None (Preliminary result)   Collection Time: 08/16/16  6:41 AM  Result Value Ref Range Status   Specimen Description BLOOD BLOOD RIGHT HAND  Final   Special Requests IN PEDIATRIC BOTTLE Blood Culture adequate volume  Final   Culture   Final    NO GROWTH 3 DAYS Performed at [facility], [address], [place], [place] [address]    Report Status PENDING  Incomplete         Radiology Studies: No results found.      Scheduled Meds: . aspirin EC  81 mg Oral Daily  . atorvastatin  40 mg Oral Daily  . enoxaparin (LOVENOX) injection  40 mg Subcutaneous QHS  . ezetimibe  10 mg Oral Daily  . insulin aspart  0-5 Units Subcutaneous QHS  . insulin aspart  0-9 Units Subcutaneous TID WC   Continuous Infusions: . ampicillin (OMNIPEN) IV Stopped (08/19/16 0544)     LOS: 5 days        Keith Lloyd, MD [facility] Pager [phone]  If 7PM-7AM, please contact night-coverage [email or web address] Password [email or web address] 08/19/2016, 10:51 AM

## 2016-08-19 NOTE — Progress Notes (Signed)
    [facility] has been requested to perform a transesophageal echocardiogram on Keith Ortiz for bacteremia.  After careful review of history and examination, the risks and benefits of transesophageal echocardiogram have been explained including risks of esophageal damage, perforation (1:10,000 risk), bleeding, pharyngeal hematoma as well as other potential complications associated with conscious sedation including aspiration, arrhythmia, respiratory failure and death. Alternatives to treatment were discussed, questions were answered. Patient is willing to proceed.  Scheduled tomorrow at 3 pm.  [facility] by 2:30 pm   Nada BoozerLaura Fujie Dickison, NP  08/19/2016 12:57 PM

## 2016-08-19 NOTE — Progress Notes (Signed)
Spoke with bedside RN about arranging [facility] for patient's TEE on 8.7.18 for arrival time of 12:30 for 1400 procedure.

## 2016-08-19 NOTE — Progress Notes (Signed)
Peripherally Inserted Central Catheter/Midline Placement  The IV Nurse has discussed with the patient and/or persons authorized to consent for the patient, the purpose of this procedure and the potential benefits and risks involved with this procedure.  The benefits include less needle sticks, lab draws from the catheter, and the patient may be discharged home with the catheter. Risks include, but not limited to, infection, bleeding, blood clot (thrombus formation), and puncture of an artery; nerve damage and irregular heartbeat and possibility to perform a PICC exchange if needed/ordered by physician.  Alternatives to this procedure were also discussed.  B[employer] PICC patient education guide, fact sheet on infection prevention and patient information card has been provided to patient /or left at bedside.    PICC/Midline Placement Documentation        Keith Ortiz, Keith Ortiz 08/19/2016, 4:22 PM

## 2016-08-19 NOTE — Care Management Note (Signed)
Case Management Note  Patient Details  Name: Steffanie RainwaterJames S Angert MRN: [id number] Date of Birth: 1950-06-19  Subjective/Objective:       Urosepsis dc expected within 24 to 48 hours to home             Action/Plan: Date:  August 19, 2016 Chart reviewed for concurrent status and case management needs. Will continue to follow patient progress. Discharge Planning: following for needs Expected discharge date: [id number] Marcelle SmilingRhonda Davis, BSN, AlixRN3, [place]CCM   [phone]  Expected Discharge Date:                  Expected Discharge Plan:  Home/Self Care  In-House Referral:     Discharge planning Services  CM Consult  Post Acute Care Choice:    Choice offered to:     DME Arranged:    DME Agency:     HH Arranged:    HH Agency:     Status of Service:  In process, will continue to follow  If discussed at Long Length of Stay Meetings, dates discussed:    Additional Comments:  [name], Rhonda Lynn, RN 08/19/2016, 10:30 AM

## 2016-08-20 ENCOUNTER — Inpatient Hospital Stay (HOSPITAL_COMMUNITY): Payer: No Typology Code available for payment source

## 2016-08-20 ENCOUNTER — Inpatient Hospital Stay (HOSPITAL_COMMUNITY): Payer: No Typology Code available for payment source | Admitting: Anesthesiology

## 2016-08-20 ENCOUNTER — Encounter (HOSPITAL_COMMUNITY): Payer: Self-pay | Admitting: *Deleted

## 2016-08-20 ENCOUNTER — Encounter (HOSPITAL_COMMUNITY)
Admission: EM | Disposition: A | Discharge status: Home Health | Payer: Self-pay | Source: Home / Self Care | Attending: Internal Medicine

## 2016-08-20 DIAGNOSIS — R7881 Bacteremia: Secondary | ICD-10-CM

## 2016-08-20 HISTORY — PX: TEE WITHOUT CARDIOVERSION: SHX5443

## 2016-08-20 LAB — CBC WITH DIFFERENTIAL/PLATELET
Basophils Absolute: 0 10*3/uL (ref 0.0–0.1)
Basophils Relative: 0 %
EOS PCT: 1 %
Eosinophils Absolute: 0.1 10*3/uL (ref 0.0–0.7)
HEMATOCRIT: 29.1 % — AB (ref 39.0–52.0)
Hemoglobin: 10.2 g/dL — ABNORMAL LOW (ref 13.0–17.0)
LYMPHS ABS: 1.6 10*3/uL (ref 0.7–4.0)
LYMPHS PCT: 19 %
MCH: 33.1 pg (ref 26.0–34.0)
MCHC: 35.1 g/dL (ref 30.0–36.0)
MCV: 94.5 fL (ref 78.0–100.0)
MONO ABS: 0.9 10*3/uL (ref 0.1–1.0)
Monocytes Relative: 10 %
NEUTROS ABS: 5.9 10*3/uL (ref 1.7–7.7)
Neutrophils Relative %: 70 %
Platelets: 181 10*3/uL (ref 150–400)
RBC: 3.08 MIL/uL — AB (ref 4.22–5.81)
RDW: 13.5 % (ref 11.5–15.5)
WBC: 8.6 10*3/uL (ref 4.0–10.5)

## 2016-08-20 LAB — BASIC METABOLIC PANEL
ANION GAP: 8 (ref 5–15)
BUN: 13 mg/dL (ref 6–20)
CHLORIDE: 108 mmol/L (ref 101–111)
CO2: 25 mmol/L (ref 22–32)
Calcium: 8.8 mg/dL — ABNORMAL LOW (ref 8.9–10.3)
Creatinine, Ser: 0.98 mg/dL (ref 0.61–1.24)
GFR calc Af Amer: >60 mL/min (ref >60)
GFR calc non Af Amer: >60 mL/min (ref >60)
GLUCOSE: 132 mg/dL — AB (ref 65–99)
POTASSIUM: 3.6 mmol/L (ref 3.5–5.1)
Sodium: 141 mmol/L (ref 135–145)

## 2016-08-20 LAB — MAGNESIUM: Magnesium: 1.8 mg/dL (ref 1.7–2.4)

## 2016-08-20 LAB — GLUCOSE, CAPILLARY
Glucose-Capillary: 120 mg/dL — ABNORMAL HIGH (ref 65–99)
Glucose-Capillary: 115 mg/dL — ABNORMAL HIGH (ref 65–99)
Glucose-Capillary: 110 mg/dL — ABNORMAL HIGH (ref 65–99)

## 2016-08-20 LAB — URIC ACID: Uric Acid, Serum: 5.3 mg/dL (ref 4.4–7.6)

## 2016-08-20 LAB — C-REACTIVE PROTEIN: CRP: 9.9 mg/dL — ABNORMAL HIGH (ref <1.0)

## 2016-08-20 SURGERY — ECHOCARDIOGRAM, TRANSESOPHAGEAL
Anesthesia: Monitor Anesthesia Care

## 2016-08-20 MED ORDER — BUTAMBEN-TETRACAINE-BENZOCAINE 2-2-14 % EX AERO
INHALATION_SPRAY | CUTANEOUS | Status: DC | PRN
  Administered 2016-08-20: 2 via TOPICAL

## 2016-08-20 MED ORDER — SODIUM CHLORIDE 0.9 % IV SOLN: 2.0000 g | Freq: Four times a day (QID) | INTRAVENOUS | 0 refills | Status: AC

## 2016-08-20 MED ORDER — PROPOFOL 500 MG/50ML IV EMUL
INTRAVENOUS | Status: DC | PRN
  Administered 2016-08-20: 100 ug/kg/min via INTRAVENOUS
  Administered 2016-08-20: 14:00:00 via INTRAVENOUS

## 2016-08-20 MED ORDER — SODIUM CHLORIDE 0.9 % IV SOLN
INTRAVENOUS | Status: DC
  Administered 2016-08-20: 13:00:00 via INTRAVENOUS

## 2016-08-20 MED ORDER — LIDOCAINE HCL (CARDIAC) 20 MG/ML IV SOLN
INTRAVENOUS | Status: DC | PRN
  Administered 2016-08-20: 100 mg via INTRAVENOUS

## 2016-08-20 MED ORDER — MIDAZOLAM HCL 2 MG/2ML IJ SOLN
INTRAMUSCULAR | Status: DC | PRN
  Administered 2016-08-20: 2 mg via INTRAVENOUS

## 2016-08-20 MED ORDER — PROPOFOL 10 MG/ML IV BOLUS
INTRAVENOUS | Status: DC | PRN
  Administered 2016-08-20: 20 mg via INTRAVENOUS
  Administered 2016-08-20: 10 mg via INTRAVENOUS
  Administered 2016-08-20: 50 mg via INTRAVENOUS
  Administered 2016-08-20: 20 mg via INTRAVENOUS

## 2016-08-20 NOTE — Progress Notes (Signed)
    [facility]    Date of Admission:  08/14/2016   Total days of antibiotics 7        Day 6 ampicillin           ID: Steffanie RainwaterJames S Gashi is a 66 y.o. male with enterococcal bacteremia of urinary source Principal Problem:   Sepsis (HCC) Active Problems:   Renal calculus, left   Essential hypertension   AKI (acute kidney injury) (HCC)   Type 2 diabetes mellitus without complication, without long-term current use of insulin (HCC)    Subjective: Afebrile. Underwent TEE which was negative for vegetation  Medications:  . aspirin EC  81 mg Oral Daily  . atorvastatin  40 mg Oral Daily  . enoxaparin (LOVENOX) injection  40 mg Subcutaneous QHS  . ezetimibe  10 mg Oral Daily  . insulin aspart  0-5 Units Subcutaneous QHS  . insulin aspart  0-9 Units Subcutaneous TID WC    Objective: Vital signs in last 24 hours: Temp:  [97.9 F (36.6 C)-98.1 F (36.7 C)] 97.9 F (36.6 C) (08/07 1542) Pulse Rate:  [61-95] 78 (08/07 1542) Resp:  [14-23] 20 (08/07 1542) BP: (129-173)/(72-87) 155/85 (08/07 1542) SpO2:  [94 %-100 %] 97 % (08/07 1542) Weight:  [218 lb (98.9 kg)] 218 lb (98.9 kg) (08/07 1239)    Lab Results  Recent Labs  08/19/16 0544 08/20/16 0416  WBC 8.6 8.6  HGB 10.2* 10.2*  HCT 29.8* 29.1*  NA 142 141  K 3.6 3.6  CL 110 108  CO2 24 25  BUN 13 13  CREATININE 1.11 0.98   Liver Panel No results for input(s): PROT, ALBUMIN, AST, ALT, ALKPHOS, BILITOT, BILIDIR, IBILI in the last 72 hours. Sedimentation Rate No results for input(s): ESRSEDRATE in the last 72 hours. C-Reactive Protein  Recent Labs  08/19/16 0544 08/20/16 0416  CRP 7.3* 9.9*    Microbiology:  Studies/Results: Dg Foot 2 Views Left  Result Date: 08/19/2016 CLINICAL DATA:  Left foot/ankle pain. EXAM: LEFT FOOT - 2 VIEW COMPARISON:  None. FINDINGS: There is a mild hallux valgus deformity identified. Do there are secondary degenerative changes noted at the first MTP joint. Small  plantar heel spur. Vascular calcifications identified. IMPRESSION: 1. No acute findings. 2. First MTP joint osteoarthritis. Electronically Signed   By: Signa Kellaylor  Stroud M.D.   On: 08/19/2016 11:44     Assessment/Plan: Complicated enterococcal bacteremia associated with uti/urologic procedure = bacteremia cleared on 8/3. TEE negative for vegetation. Plan to treat for a total of 14 days using 8/3 as day 1.  Home health orders listed below Diagnosis: bacteremia  Culture Result:enterococcus  No Known Allergies  OPAT Orders Discharge antibiotics: Per pharmacy protocol ampicillin  Duration: 10 days End Date: 08/30/16  [facility] Per Protocol:  Labs weekly while on IV antibiotics: _x_ CBC with differential _x_ BMP   _x Please pull PIC at completion of IV antibiotics   Fax weekly labs to [phone]  Clinic Follow Up Appt: 2 wk at ID clinic  @    [facility], [facility] Cell: [phone] Pager: [phone]  08/20/2016, 3:51 PM

## 2016-08-20 NOTE — Progress Notes (Signed)
Patient ID: Keith Ortiz, male   DOB: [birth year], 66 y.o.   MRN: [id number]  PROGRESS NOTE    Keith Ortiz  [name]:[id number] DOB: [birth year] DOA: 08/14/2016 PCP: Iona HansenJones, Penny L, NP   Brief Narrative:  66 year old male with history of NIDDM, HTN, left renal stone status post lithotripsy,ureteral stent placement on 07/18/2016 and recent admission from 07/22/2016 to 07/25/2016 for UTI secondary to enterococcus and acute kidney injury status was discharged on oral Augmentin which patient has completed and patient had stent removal last Thursday. Patient presented with fever and dysuria and suprapubic pain and was found to have a UTI with tachycardia and increased lactate and leukocytosis. Patient was admitted on intravenous antibiotics.Urology was consulted. Blood culture grew enterococcus faecalis. ID was consulted. Patient is plan for TEE today.   Assessment & Plan:   Principal Problem:   Sepsis (HCC) Active Problems:   Renal calculus, left   Essential hypertension   AKI (acute kidney injury) (HCC)   Type 2 diabetes mellitus without complication, without long-term current use of insulin (HCC)  Enterococcus fecalisbacteremia - Continue ampicillin. Follow ID recommendations. repeat cultures done on 08/16/16 are negative so far. - 2-D echo done on 08/15/16 was suspicious for non-oscillating density on mitral valve.  -Patient is scheduled for TEE today.  Sepsis: - Resolved  Probable complicated urinary tract infection/Acute pyelonephritisin a patient with history of recent ureteral stent removal and recent treatment for enterococcal UTI - CAT scan of the abdomen did not show any obstructing stones. Urology consult appreciated. Outpatient follow up with Urology - Continue ampicillin.   Left foot pain - Questionable cause. Improved. X-ray of the left foot is negative for fracture.  Leukocytosis -Resolved  Acute kidney injury: From sepsis/pyelo.  - Creatinine improved.    Diabetes: Normoglycemic at admission -Hold metformin -Low dose SSI  Hypertension: -Blood pressure stable. Antihypertensives on hold for now -Continue aspirin, statin, Zetia  Thrombocytopenia: - Probably from sepsis. - Resolved  Hypomagnesemia - Improved   DVT prophylaxis:Lovenox Code Status:FULL Family Communication:none at bedside  Disposition Plan:home in 1-2days Consulants:Urology; ID; cardiology for TEE Procedures: 2-D echo on 08/15/16 Impressions:  - Normal LV systolic function; mild diastolic dysfunction; calcified aortic valve with mild AS (mean gradient 10 mmHg); thickened MV with nonoscillating density on anterior leaflet likely related to calcification; mild MR; trace TR with mildly elevated pulmonary pressure. Consider TEE in 1-2 weeks to reassess MV density.  Antimicrobials: Zosyn from 08/14/2016 - 08/15/16 Ampicillin from 08/15/16 onwards   Subjective: Patient seen and examined at bedside. He denies any overnight fever, nausea or vomiting. His left foot pain is resolved.  Objective: Vitals:   08/18/16 2019 08/19/16 0525 08/19/16 2138 08/20/16 0603  BP: (!) 147/88 135/70 129/72 (!) 148/74  Pulse: 76 68 61 69  Resp: 18 18  18   Temp: 99 F (37.2 C) 97.8 F (36.6 C) 98.1 F (36.7 C) 97.9 F (36.6 C)  TempSrc: Oral Oral Oral Oral  SpO2: 99% 98% 97% 100%  Weight:      Height:        Intake/Output Summary (Last 24 hours) at 08/20/16 1157 Last data filed at 08/20/16 [id number]  Gross per 24 hour  Intake              350 ml  Output             1750 ml  Net            -1400 ml   [employer] PowerFiled Weights  08/15/16 0522  Weight: 99 kg (218 lb 4.1 oz)    Examination:  General exam: Appears calm and comfortable  Respiratory system: Bilateral decreased breath sound at bases Cardiovascular system: S1 & S2 heard, Rate controlled. Gastrointestinal system: Abdomen is nondistended, soft and nontender. Normal bowel sounds  heard. Extremities: No cyanosis, clubbing; left foot tenderness is improved   Data Reviewed: I have personally reviewed following labs and imaging studies  CBC:  Recent Labs Lab 08/16/16 0641 08/17/16 0553 08/18/16 0509 08/19/16 0544 08/20/16 0416  WBC 17.1* 11.3* 8.1 8.6 8.6  NEUTROABS 14.9* 8.7* 5.6 5.9 5.9  HGB 11.1* 10.1* 10.7* 10.2* 10.2*  HCT 32.2* 29.3* 31.5* 29.8* 29.1*  MCV 95.5 96.7 96.6 95.2 94.5  PLT 121* 116* 132* 155 181   Basic Metabolic Panel:  Recent Labs Lab 08/16/16 0641 08/17/16 0553 08/18/16 0509 08/19/16 0544 08/20/16 0416  NA 137 140 142 142 141  K 3.7 3.7 3.5 3.6 3.6  CL 104 108 109 110 108  CO2 25 24 24 24 25   GLUCOSE 153* 117* 119* 111* 132*  BUN 16 14 16 13 13   CREATININE 1.48* 1.21 1.20 1.11 0.98  CALCIUM 8.6* 8.5* 8.6* 8.6* 8.8*  MG 1.6* 1.9 1.7 1.7 1.8   GFR: Estimated Creatinine Clearance: 94.1 mL/min (by C-G formula based on SCr of 0.98 mg/dL). Liver Function Tests:  Recent Labs Lab 08/14/16 2028  AST 28  ALT 35  ALKPHOS 49  BILITOT 1.1  PROT 6.7  ALBUMIN 3.7   No results for input(s): LIPASE, AMYLASE in the last 168 hours. No results for input(s): AMMONIA in the last 168 hours. Coagulation Profile: No results for input(s): INR, PROTIME in the last 168 hours. Cardiac Enzymes: No results for input(s): CKTOTAL, CKMB, CKMBINDEX, TROPONINI in the last 168 hours. BNP (last 3 results) No results for input(s): PROBNP in the last 8760 hours. HbA1C: No results for input(s): HGBA1C in the last 72 hours. CBG:  Recent Labs Lab 08/19/16 0752 08/19/16 1217 08/19/16 1657 08/19/16 2136 08/20/16 0722  GLUCAP 121* 113* 177* 152* 120*   Lipid Profile: No results for input(s): CHOL, HDL, LDLCALC, TRIG, CHOLHDL, LDLDIRECT in the last 72 hours. Thyroid Function Tests: No results for input(s): TSH, T4TOTAL, FREET4, T3FREE, THYROIDAB in the last 72 hours. Anemia Panel: No results for input(s): VITAMINB12, FOLATE, FERRITIN, TIBC,  IRON, RETICCTPCT in the last 72 hours. Sepsis Labs:  Recent Labs Lab 08/14/16 2121 08/15/16 0059  LATICACIDVEN 2.06* 1.8    Recent Results (from the past 240 hour(s))  Urine culture     Status: Abnormal   Collection Time: 08/14/16  8:10 PM  Result Value Ref Range Status   Specimen Description URINE, CLEAN CATCH  Final   Special Requests NONE  Final   Culture >=100,000 COLONIES/mL ENTEROCOCCUS FAECALIS (A)  Final   Report Status 08/17/2016 FINAL  Final   Organism ID, Bacteria ENTEROCOCCUS FAECALIS (A)  Final      Susceptibility   Enterococcus faecalis - MIC*    AMPICILLIN <=2 SENSITIVE Sensitive     LEVOFLOXACIN 1 SENSITIVE Sensitive     NITROFURANTOIN <=16 SENSITIVE Sensitive     VANCOMYCIN 2 SENSITIVE Sensitive     * >=100,000 COLONIES/mL ENTEROCOCCUS FAECALIS  Blood Culture (routine x 2)     Status: Abnormal   Collection Time: 08/14/16  8:33 PM  Result Value Ref Range Status   Specimen Description BLOOD RIGHT ANTECUBITAL  Final   Special Requests   Final    BOTTLES DRAWN AEROBIC  AND ANAEROBIC Blood Culture adequate volume   Culture  Setup Time   Final    GRAM POSITIVE COCCI IN CHAINS IN BOTH AEROBIC AND ANAEROBIC BOTTLES CRITICAL VALUE NOTED.  VALUE IS CONSISTENT WITH PREVIOUSLY REPORTED AND CALLED VALUE. Performed at [facility], [address] [place], [place] [address]    Culture ENTEROCOCCUS FAECALIS (A)  Final   Report Status 08/17/2016 FINAL  Final   Organism ID, Bacteria ENTEROCOCCUS FAECALIS  Final      Susceptibility   Enterococcus faecalis - MIC*    AMPICILLIN <=2 SENSITIVE Sensitive     VANCOMYCIN 2 SENSITIVE Sensitive     GENTAMICIN SYNERGY SENSITIVE Sensitive     * ENTEROCOCCUS FAECALIS  Blood Culture (routine x 2)     Status: Abnormal   Collection Time: 08/14/16  9:04 PM  Result Value Ref Range Status   Specimen Description BLOOD LEFT FOREARM  Final   Special Requests   Final    BOTTLES DRAWN AEROBIC AND ANAEROBIC Blood Culture adequate  volume   Culture  Setup Time   Final    GRAM POSITIVE COCCI IN CHAINS IN BOTH AEROBIC AND ANAEROBIC BOTTLES CRITICAL RESULT CALLED TO, READ BACK BY AND VERIFIED WITH: Azzie Glatter Pharm.D. 15:50 08/15/16 ([email or web address])    Culture (A)  Final    ENTEROCOCCUS FAECALIS SUSCEPTIBILITIES PERFORMED ON PREVIOUS CULTURE WITHIN THE LAST 5 DAYS. Performed at [facility], [address], [place], [place] [address]    Report Status 08/17/2016 FINAL  Final  Blood Culture ID Panel (Reflexed)     Status: Abnormal   Collection Time: 08/14/16  9:04 PM  Result Value Ref Range Status   Enterococcus species DETECTED (A) NOT DETECTED Final    Comment: CRITICAL RESULT CALLED TO, READ BACK BY AND VERIFIED WITH: Mardee Postin.D. 15:50 08/15/16 ([email or web address])    Vancomycin resistance NOT DETECTED NOT DETECTED Final   Listeria monocytogenes NOT DETECTED NOT DETECTED Final   Staphylococcus species NOT DETECTED NOT DETECTED Final   Staphylococcus aureus NOT DETECTED NOT DETECTED Final   Streptococcus species NOT DETECTED NOT DETECTED Final   Streptococcus agalactiae NOT DETECTED NOT DETECTED Final   Streptococcus pneumoniae NOT DETECTED NOT DETECTED Final   Streptococcus pyogenes NOT DETECTED NOT DETECTED Final   Acinetobacter baumannii NOT DETECTED NOT DETECTED Final   Enterobacteriaceae species NOT DETECTED NOT DETECTED Final   Enterobacter cloacae complex NOT DETECTED NOT DETECTED Final   Escherichia coli NOT DETECTED NOT DETECTED Final   Klebsiella oxytoca NOT DETECTED NOT DETECTED Final   Klebsiella pneumoniae NOT DETECTED NOT DETECTED Final   Proteus species NOT DETECTED NOT DETECTED Final   Serratia marcescens NOT DETECTED NOT DETECTED Final   Haemophilus influenzae NOT DETECTED NOT DETECTED Final   Neisseria meningitidis NOT DETECTED NOT DETECTED Final   Pseudomonas aeruginosa NOT DETECTED NOT DETECTED Final   Candida albicans NOT DETECTED NOT DETECTED Final   Candida glabrata NOT DETECTED NOT DETECTED Final    Candida krusei NOT DETECTED NOT DETECTED Final   Candida parapsilosis NOT DETECTED NOT DETECTED Final   Candida tropicalis NOT DETECTED NOT DETECTED Final    Comment: Performed at [facility], [address], [place], [place] [address]  Culture, blood (routine x 2)     Status: None (Preliminary result)   Collection Time: 08/16/16  6:41 AM  Result Value Ref Range Status   Specimen Description BLOOD BLOOD LEFT ARM  Final   Special Requests   Final  BOTTLES DRAWN AEROBIC AND ANAEROBIC Blood Culture adequate volume   Culture   Final    NO GROWTH 3 DAYS Performed at [facility], [address] [place], [place] [address]    Report Status PENDING  Incomplete  Culture, blood (routine x 2)     Status: None (Preliminary result)   Collection Time: 08/16/16  6:41 AM  Result Value Ref Range Status   Specimen Description BLOOD BLOOD RIGHT HAND  Final   Special Requests IN PEDIATRIC BOTTLE Blood Culture adequate volume  Final   Culture   Final    NO GROWTH 3 DAYS Performed at [facility], [address], [place], [place] [address]    Report Status PENDING  Incomplete         Radiology Studies: Dg Foot 2 Views Left  Result Date: 08/19/2016 CLINICAL DATA:  Left foot/ankle pain. EXAM: LEFT FOOT - 2 VIEW COMPARISON:  None. FINDINGS: There is a mild hallux valgus deformity identified. Do there are secondary degenerative changes noted at the first MTP joint. Small plantar heel spur. Vascular calcifications identified. IMPRESSION: 1. No acute findings. 2. First MTP joint osteoarthritis. Electronically Signed   By: Signa Kell M.D.   On: 08/19/2016 11:44        Scheduled Meds: . aspirin EC  81 mg Oral Daily  . atorvastatin  40 mg Oral Daily  . enoxaparin (LOVENOX) injection  40 mg Subcutaneous QHS  . ezetimibe  10 mg Oral Daily  . indomethacin  50 mg Oral TID WC  . insulin aspart  0-5 Units Subcutaneous QHS  . insulin aspart  0-9 Units Subcutaneous TID WC    Continuous Infusions: . ampicillin (OMNIPEN) IV Stopped (08/20/16 0551)     LOS: 6 days        Glade Lloyd, MD [facility] Pager [phone]  If 7PM-7AM, please contact night-coverage [email or web address] Password [email or web address] 08/20/2016, 11:57 AM

## 2016-08-20 NOTE — Progress Notes (Signed)
Date: August 20, 2016 Chart reviewed for discharge orders: Patient being dcd on iv abx. Jeri ModenaPam Chandler with [facility] following and notified. Clide Daleshonda Zarianna Dicarlo,BSN,RN3, [phone]

## 2016-08-20 NOTE — Anesthesia Postprocedure Evaluation (Signed)
Anesthesia Post Note  Patient: Keith Ortiz  Procedure(s) Performed: Procedure(s) (LRB): TRANSESOPHAGEAL ECHOCARDIOGRAM (TEE) (N/A)     Patient location during evaluation: PACU Anesthesia Type: MAC Level of consciousness: awake and alert Pain management: pain level controlled Vital Signs Assessment: post-procedure vital signs reviewed and stable Respiratory status: spontaneous breathing and respiratory function stable Cardiovascular status: stable Anesthetic complications: no    Last Vitals:  Vitals:   08/20/16 1431 08/20/16 1440  BP: (!) 143/87 130/74  Pulse: 95 90  Resp: 14 (!) 23  Temp: 36.7 C     Last Pain:  Vitals:   08/20/16 1431  TempSrc: Oral  PainSc:                  Ishaq Maffei,Tully DANIEL

## 2016-08-20 NOTE — Anesthesia Preprocedure Evaluation (Signed)
Anesthesia Evaluation  Patient identified by MRN, date of birth, ID band Patient awake    Reviewed: Allergy & Precautions, NPO status , Patient's Chart, lab work & pertinent test results  Airway Mallampati: II  TM Distance: >3 FB Neck ROM: Full    Dental no notable dental hx. (+) Dental Advisory Given   Pulmonary neg pulmonary ROS,    Pulmonary exam normal breath sounds clear to auscultation       Cardiovascular hypertension, Pt. on medications negative cardio ROS Normal cardiovascular exam Rhythm:Regular Rate:Normal     Neuro/Psych negative neurological ROS  negative psych ROS   GI/Hepatic negative GI ROS, Neg liver ROS,   Endo/Other  negative endocrine ROSdiabetes, Type 2, Oral Hypoglycemic Agents  Renal/GU negative Renal ROS  negative genitourinary   Musculoskeletal negative musculoskeletal ROS (+) Arthritis , Osteoarthritis,    Abdominal (+) + obese,   Peds negative pediatric ROS (+)  Hematology negative hematology ROS (+)   Anesthesia Other Findings   Reproductive/Obstetrics negative OB ROS                             Anesthesia Physical  Anesthesia Plan  ASA: II  Anesthesia Plan: MAC   Post-op Pain Management:    Induction: Intravenous  PONV Risk Score and Plan: 1 and Propofol infusion  Airway Management Planned: Natural Airway and Simple Face Mask  Additional Equipment:   Intra-op Plan:   Post-operative Plan: Extubation in OR  Informed Consent: I have reviewed the patients History and Physical, chart, labs and discussed the procedure including the risks, benefits and alternatives for the proposed anesthesia with the patient or authorized representative who has indicated his/her understanding and acceptance.   Dental advisory given  Plan Discussed with: CRNA  Anesthesia Plan Comments:         Anesthesia Quick Evaluation

## 2016-08-20 NOTE — Transfer of Care (Signed)
Immediate Anesthesia Transfer of Care Note  Patient: Keith Ortiz  Procedure(s) Performed: Procedure(s): TRANSESOPHAGEAL ECHOCARDIOGRAM (TEE) (N/A)  Patient Location: PACU  Anesthesia Type:MAC  Level of Consciousness: awake, alert  and oriented  Airway & Oxygen Therapy: Patient Spontanous Breathing and Patient connected to nasal cannula oxygen  Post-op Assessment: Report given to RN and Post -op Vital signs reviewed and stable  Post vital signs: Reviewed and stable  Last Vitals:  Vitals:   08/20/16 1239 08/20/16 1431  BP: (!) 173/80 (!) 143/87  Pulse: 84 95  Resp: 15 14  Temp:      Last Pain:  Vitals:   08/20/16 1239  TempSrc: Oral  PainSc:       Patients Stated Pain Goal: 2 (11/00/34 9611)  Complications: No apparent anesthesia complications

## 2016-08-20 NOTE — Progress Notes (Signed)
  Echocardiogram Echocardiogram Transesophageal has been performed.  Janalyn HarderWest, Amylah Will R 08/20/2016, 2:41 PM

## 2016-08-20 NOTE — Discharge Summary (Signed)
Physician Discharge Summary  KEITHEN CAPO XLK:[id number] DOB: September 07, 1950 DOA: 08/14/2016  PCP: Iona Hansen, NP  Admit date: 08/14/2016 Discharge date: 08/20/2016  Admitted From: Home Disposition:  Home  Recommendations for Outpatient Follow-up:  1. Follow up with PCP in 1 weeks 2. Follow-up with Dr. Snider/infectious diseases in 2 weeks 3. Weekly CBC/BMP while the patient is on IV antibiotics. Reports need to be faxed to [phone] 4. Follow-up with Dr. Luisa Hart McKenzie/urology in 1-2 weeks or as scheduled 5. Follow up in the ED if symptoms worsen or new appear   Home Health: Yes Equipment/Devices: PICC line  Discharge Condition: Stable CODE STATUS: Full Diet recommendation: Heart Healthy / Carb Modified   Brief/Interim Summary: 66 year old male with history of NIDDM, HTN, left renal stone status post lithotripsy,ureteral stent placement on 07/18/2016 and recent admission from 07/22/2016 to 07/25/2016 for UTI secondary to enterococcus and acute kidney injury status was discharged on oral Augmentin which patient has completed and patient had stent removal last Thursday. Patient presented with fever and dysuria and suprapubic pain and was found to have a UTI with tachycardia and increased lactate and leukocytosis. Patient was admitted on intravenous antibiotics.Urology was consulted. Blood culture grew enterococcus faecalis. ID was consulted. Patient had TEE today which was negative for vegetation. he'll be discharged on 10 more days of IV ampicillin as per ID recommendations.  Discharge Diagnoses:  Principal Problem:   Sepsis (HCC) Active Problems:   Renal calculus, left   Essential hypertension   AKI (acute kidney injury) (HCC)   Type 2 diabetes mellitus without complication, without long-term current use of insulin (HCC)  Enterococcus fecalisbacteremia - Currently on ampicillin. repeat cultures done on 08/16/16 are negative so far. - 2-D echo done on 08/15/16 was suspicious  for non-oscillating density on mitral valve.  -TEE today is negative for any vegetation. - he'll be discharged on 10 more days of IV ampicillin as per ID recommendations. Patient already has a PICC line. Weekly CBC/BMP while on IV antibiotics to be faxed to ID office.   Sepsis: - Resolved  Probable complicated urinary tract infection/Acute pyelonephritisin a patient with history of recent ureteral stent removal and recent treatment for enterococcal UTI - CAT scan of the abdomen did not show any obstructing stones. Urology consult appreciated. Outpatient follow up with Urology - Continue ampicillin.   Left foot pain - Questionable cause. Improved. X-ray of the left foot is negative for fracture.  Leukocytosis -Resolved  Acute kidney injury: From sepsis/pyelo.  - Creatinine improved.   Diabetes: Normoglycemic at admission -Resume metformin on discharge. Outpatient follow-up  Hypertension: -Blood pressure stable. Continue home antihypertensives -Continue aspirin, statin, Zetia  Thrombocytopenia: - Probably from sepsis. - Resolved  Hypomagnesemia - Improved   Discharge Instructions  Discharge Instructions    Call MD for:  difficulty breathing, headache or visual disturbances    Complete by:  As directed    Call MD for:  extreme fatigue    Complete by:  As directed    Call MD for:  hives    Complete by:  As directed    Call MD for:  persistant dizziness or light-headedness    Complete by:  As directed    Call MD for:  persistant nausea and vomiting    Complete by:  As directed    Call MD for:  severe uncontrolled pain    Complete by:  As directed    Call MD for:  temperature >100.4    Complete by:  As directed    Diet - low sodium heart healthy    Complete by:  As directed    Diet Carb Modified    Complete by:  As directed    Discharge instructions    Complete by:  As directed    Weekly BMP and CBC while on iv antibiotics and fax report to Dr.  Feliz Beam office DC PICC line after completion of Antibiotics   Increase activity slowly    Complete by:  As directed      Allergies as of 08/20/2016   No Known Allergies     Medication List    STOP taking these medications   nitrofurantoin (macrocrystal-monohydrate) 100 MG capsule Commonly known as:  MACROBID     TAKE these medications   acetaminophen 500 MG tablet Commonly known as:  TYLENOL Take 1,000 mg by mouth every 6 (six) hours as needed for mild pain.   ampicillin 2 g in sodium chloride 0.9 % 50 mL Inject 2 g into the vein every 6 (six) hours.   aspirin EC 81 MG tablet Take 81 mg by mouth daily.   atorvastatin 40 MG tablet Commonly known as:  LIPITOR Take 40 mg by mouth daily.   ezetimibe 10 MG tablet Commonly known as:  ZETIA Take 10 mg by mouth daily.   metFORMIN 500 MG 24 hr tablet Commonly known as:  GLUCOPHAGE-XR Take 500 mg by mouth daily.   phenazopyridine 97 MG tablet Commonly known as:  PYRIDIUM Take 97 mg by mouth 2 (two) times daily as needed for pain.   valsartan-hydrochlorothiazide 160-12.5 MG tablet Commonly known as:  DIOVAN-HCT Take 1 tablet by mouth daily.      Follow-up Information    Iona Hansen, NP Follow up in 1 week(s).   Specialty:  Nurse Practitioner Contact information: [address] [place] [address] [phone]        Judyann Munson, MD Follow up in 2 day(s).   Specialty:  Infectious Diseases Why:  please fax weekly lab results of CBC and BMP to [phone] while patient is on iv antibiotics Contact information: [address] [place] [address] [phone]        Malen Gauze, MD Follow up in 1 week(s).   Specialty:  Urology Contact information: [address] [place] [address] [phone]          No Known Allergies  Consultations:  Urology and ID   Procedures/Studies: Ct Abdomen Pelvis Wo Contrast  Result Date: 08/15/2016 CLINICAL DATA:   NIDDM, HTN, left renal stone status post lithotripsy, ureteral stent placement on 07/18/2016 and recent admission from 07/22/2016 to 07/25/2016 for UTI secondary to enterococcus and acute kidney injury status was discharged on oral Augmentin which patient has completed and patient had stent removal last Thursday. Patient presented with fever and dysuria and suprapubic pain and was found to have a UTI with tachycardia and increased lactate and leukocytosis. Patient was admitted on intravenous antibiotics. EXAM: CT ABDOMEN AND PELVIS WITHOUT CONTRAST TECHNIQUE: Multidetector CT imaging of the abdomen and pelvis was performed following the standard protocol without IV contrast. COMPARISON:  07/11/2016 FINDINGS: Lower chest: Scattered coronary calcifications. Dependent atelectasis posteriorly in the lung bases left greater than right as before. Hepatobiliary: Fatty liver without focal lesion. Innumerable small partially calcified stones fill the nondilated gallbladder. No biliary ductal dilatation. Pancreas: Unremarkable. No pancreatic ductal dilatation or surrounding inflammatory changes. Spleen: Normal in size without focal abnormality. Adrenals/Urinary Tract: Normal  adrenals. Interval removal of left percutaneous nephrostomy catheter and ureteral stent. Bilateral nephrolithiasis, largest stone on the left in the lower pole 6 mm, on the right in the lower pole 7 mm. Mild left pelvicaliectasis and ureterectasis without evidence of obstructing calculus. Some increase in streaky inflammatory/edematous changes in the left perinephric fascia. Urinary bladder physiologically distended, with 12 mm calculus in its dependent aspect. Stomach/Bowel: Stomach and small bowel are nondilated. Appendix not identified. The colon is nondistended, unremarkable. Vascular/Lymphatic: Scattered aortoiliac arterial calcifications without aneurysm. No abdominal or pelvic adenopathy. Bilateral pelvic phleboliths. Reproductive: Moderate  prostatic enlargement with central coarse calcifications. Other: No ascites.  No free air. Musculoskeletal: No acute or significant osseous findings. IMPRESSION: 1. Left pelvicaliectasis and mild ureterectasis without evidence of obstructing calculus, post removal of nephrostomy and ureteral stent. There is also some increase in left perinephric inflammatory/edematous changes. 2. Bilateral nephrolithiasis as above. 3. 12 mm calculus in the urinary bladder. 4. Coronary and Aortic Atherosclerosis (ICD10-170.0) 5. Fatty liver 6. Cholelithiasis Electronically Signed   By: Corlis Leak M.D.   On: 08/15/2016 12:19   US Renal  Result Date: 07/22/2016 CLINICAL DATA:  Status post second look percutaneous left nephrolithotomy on 07/18/2016 for a stone measuring more than 2 cm. EXAM: RENAL / URINARY TRACT ULTRASOUND COMPLETE COMPARISON:  CT abdomen and pelvis 07/11/2016. FINDINGS: Right Kidney: Length: 12.4 cm. Echogenicity within normal limits. No mass or hydronephrosis visualized. Shadowing stones are identified measuring up to 1.2 cm. Left Kidney: Length: 12.1 cm.  Stent is in place.  No hydronephrosis.  No mass. Bladder: Appears normal for degree of bladder distention.  Stent is noted. IMPRESSION: Negative for hydronephrosis with a left double-J ureteral stent in place. No stones are seen on the left. Nonobstructing right renal stones. Electronically Signed   By: Drusilla Kanner M.D.   On: 07/22/2016 13:41   Dg Chest Port 1 View  Result Date: 07/22/2016 CLINICAL DATA:  66 year old diabetic hypertensive male with nausea and weakness. Possible sepsis. Initial encounter. Initial encounter. EXAM: PORTABLE CHEST 1 VIEW COMPARISON:  None. FINDINGS: Right costophrenic angle not included on present exam. No infiltrate, congestive heart failure or pneumothorax. No plain film evidence of pulmonary malignancy. Patient would benefit from follow-up two-view chest cardiac leads removed when able. Heart size top-normal. IMPRESSION:  No infiltrate or pulmonary edema. Heart size top-normal. Electronically Signed   By: Lacy Duverney M.D.   On: 07/22/2016 14:34   Dg Foot 2 Views Left  Result Date: 08/19/2016 CLINICAL DATA:  Left foot/ankle pain. EXAM: LEFT FOOT - 2 VIEW COMPARISON:  None. FINDINGS: There is a mild hallux valgus deformity identified. Do there are secondary degenerative changes noted at the first MTP joint. Small plantar heel spur. Vascular calcifications identified. IMPRESSION: 1. No acute findings. 2. First MTP joint osteoarthritis. Electronically Signed   By: Signa Kell M.D.   On: 08/19/2016 11:44    2-D echo on 08/15/16 Impressions:  - Normal LV systolic function; mild diastolic dysfunction; calcified aortic valve with mild AS (mean gradient 10 mmHg); thickened MV with nonoscillating density on anterior leaflet likely related to calcification; mild MR; trace TR with mildly elevated pulmonary pressure. Consider TEE in 1-2 weeks to reassess MV density.  TEE on 08/20/2016: No vegetations   Subjective: Patient seen and examined at bedside. He denies any overnight fever, nausea or vomiting. His left foot pain is resolved.  Discharge Exam: Vitals:   08/20/16 1510 08/20/16 1542  BP: (!) 149/72 (!) 155/85  Pulse: 76 78  Resp: (!) 22 20  Temp:  97.9 F (36.6 C)   Vitals:   08/20/16 1450 08/20/16 1500 08/20/16 1510 08/20/16 1542  BP: (!) 142/83 140/74 (!) 149/72 (!) 155/85  Pulse: 87 82 76 78  Resp: 19 18 (!) 22 20  Temp:    97.9 F (36.6 C)  TempSrc:    Oral  SpO2: 95% 94% 95% 97%  Weight:      Height:        General exam: Appears calm and comfortable  Respiratory system: Bilateral decreased breath sound at bases Cardiovascular system: S1 & S2 heard, Rate controlled. Gastrointestinal system: Abdomen is nondistended, soft and nontender. Normal bowel sounds heard. Extremities: No cyanosis, clubbing; left foot tenderness is improved     The results of significant  diagnostics from this hospitalization (including imaging, microbiology, ancillary and laboratory) are listed below for reference.     Microbiology: Recent Results (from the past 240 hour(s))  Urine culture     Status: Abnormal   Collection Time: 08/14/16  8:10 PM  Result Value Ref Range Status   Specimen Description URINE, CLEAN CATCH  Final   Special Requests NONE  Final   Culture >=100,000 COLONIES/mL ENTEROCOCCUS FAECALIS (A)  Final   Report Status 08/17/2016 FINAL  Final   Organism ID, Bacteria ENTEROCOCCUS FAECALIS (A)  Final      Susceptibility   Enterococcus faecalis - MIC*    AMPICILLIN <=2 SENSITIVE Sensitive     LEVOFLOXACIN 1 SENSITIVE Sensitive     NITROFURANTOIN <=16 SENSITIVE Sensitive     VANCOMYCIN 2 SENSITIVE Sensitive     * >=100,000 COLONIES/mL ENTEROCOCCUS FAECALIS  Blood Culture (routine x 2)     Status: Abnormal   Collection Time: 08/14/16  8:33 PM  Result Value Ref Range Status   Specimen Description BLOOD RIGHT ANTECUBITAL  Final   Special Requests   Final    BOTTLES DRAWN AEROBIC AND ANAEROBIC Blood Culture adequate volume   Culture  Setup Time   Final    GRAM POSITIVE COCCI IN CHAINS IN BOTH AEROBIC AND ANAEROBIC BOTTLES CRITICAL VALUE NOTED.  VALUE IS CONSISTENT WITH PREVIOUSLY REPORTED AND CALLED VALUE. Performed at [facility], [address] [place], [place] [address]    Culture ENTEROCOCCUS FAECALIS (A)  Final   Report Status 08/17/2016 FINAL  Final   Organism ID, Bacteria ENTEROCOCCUS FAECALIS  Final      Susceptibility   Enterococcus faecalis - MIC*    AMPICILLIN <=2 SENSITIVE Sensitive     VANCOMYCIN 2 SENSITIVE Sensitive     GENTAMICIN SYNERGY SENSITIVE Sensitive     * ENTEROCOCCUS FAECALIS  Blood Culture (routine x 2)     Status: Abnormal   Collection Time: 08/14/16  9:04 PM  Result Value Ref Range Status   Specimen Description BLOOD LEFT FOREARM  Final   Special Requests   Final    BOTTLES DRAWN AEROBIC AND ANAEROBIC Blood  Culture adequate volume   Culture  Setup Time   Final    GRAM POSITIVE COCCI IN CHAINS IN BOTH AEROBIC AND ANAEROBIC BOTTLES CRITICAL RESULT CALLED TO, READ BACK BY AND VERIFIED WITH: Azzie GlatterJ. Legge Pharm.D. 15:50 08/15/16 ([email or web address])    Culture (A)  Final    ENTEROCOCCUS FAECALIS SUSCEPTIBILITIES PERFORMED ON PREVIOUS CULTURE WITHIN THE LAST 5 DAYS. Performed at [facility], [address] [place], [place] [address]    Report Status 08/17/2016 FINAL  Final  Blood Culture ID Panel (Reflexed)  Status: Abnormal   Collection Time: 08/14/16  9:04 PM  Result Value Ref Range Status   Enterococcus species DETECTED (A) NOT DETECTED Final    Comment: CRITICAL RESULT CALLED TO, READ BACK BY AND VERIFIED WITH: Mardee Postin.D. 15:50 08/15/16 ([email or web address])    Vancomycin resistance NOT DETECTED NOT DETECTED Final   Listeria monocytogenes NOT DETECTED NOT DETECTED Final   Staphylococcus species NOT DETECTED NOT DETECTED Final   Staphylococcus aureus NOT DETECTED NOT DETECTED Final   Streptococcus species NOT DETECTED NOT DETECTED Final   Streptococcus agalactiae NOT DETECTED NOT DETECTED Final   Streptococcus pneumoniae NOT DETECTED NOT DETECTED Final   Streptococcus pyogenes NOT DETECTED NOT DETECTED Final   Acinetobacter baumannii NOT DETECTED NOT DETECTED Final   Enterobacteriaceae species NOT DETECTED NOT DETECTED Final   Enterobacter cloacae complex NOT DETECTED NOT DETECTED Final   Escherichia coli NOT DETECTED NOT DETECTED Final   Klebsiella oxytoca NOT DETECTED NOT DETECTED Final   Klebsiella pneumoniae NOT DETECTED NOT DETECTED Final   Proteus species NOT DETECTED NOT DETECTED Final   Serratia marcescens NOT DETECTED NOT DETECTED Final   Haemophilus influenzae NOT DETECTED NOT DETECTED Final   Neisseria meningitidis NOT DETECTED NOT DETECTED Final   Pseudomonas aeruginosa NOT DETECTED NOT DETECTED Final   Candida albicans NOT DETECTED NOT DETECTED Final   Candida glabrata NOT DETECTED  NOT DETECTED Final   Candida krusei NOT DETECTED NOT DETECTED Final   Candida parapsilosis NOT DETECTED NOT DETECTED Final   Candida tropicalis NOT DETECTED NOT DETECTED Final    Comment: Performed at [facility], [address] [place], [place] [address]  Culture, blood (routine x 2)     Status: None (Preliminary result)   Collection Time: 08/16/16  6:41 AM  Result Value Ref Range Status   Specimen Description BLOOD BLOOD LEFT ARM  Final   Special Requests   Final    BOTTLES DRAWN AEROBIC AND ANAEROBIC Blood Culture adequate volume   Culture   Final    NO GROWTH 3 DAYS Performed at [facility], [address], [place], [place] [address]    Report Status PENDING  Incomplete  Culture, blood (routine x 2)     Status: None (Preliminary result)   Collection Time: 08/16/16  6:41 AM  Result Value Ref Range Status   Specimen Description BLOOD BLOOD RIGHT HAND  Final   Special Requests IN PEDIATRIC BOTTLE Blood Culture adequate volume  Final   Culture   Final    NO GROWTH 3 DAYS Performed at [facility], [address] [place], [place] [address]    Report Status PENDING  Incomplete     Labs: BNP (last 3 results) No results for input(s): BNP in the last 8760 hours. Basic Metabolic Panel:  Recent Labs Lab 08/16/16 0641 08/17/16 0553 08/18/16 0509 08/19/16 0544 08/20/16 0416  NA 137 140 142 142 141  K 3.7 3.7 3.5 3.6 3.6  CL 104 108 109 110 108  CO2 25 24 24 24 25   GLUCOSE 153* 117* 119* 111* 132*  BUN 16 14 16 13 13   CREATININE 1.48* 1.21 1.20 1.11 0.98  CALCIUM 8.6* 8.5* 8.6* 8.6* 8.8*  MG 1.6* 1.9 1.7 1.7 1.8   Liver Function Tests:  Recent Labs Lab 08/14/16 2028  AST 28  ALT 35  ALKPHOS 49  BILITOT 1.1  PROT 6.7  ALBUMIN 3.7   No results for input(s): LIPASE, AMYLASE in the last 168 hours. No results  for input(s): AMMONIA in the last 168 hours. CBC:  Recent Labs Lab 08/16/16 0641 08/17/16 0553 08/18/16 0509 08/19/16 0544  08/20/16 0416  WBC 17.1* 11.3* 8.1 8.6 8.6  NEUTROABS 14.9* 8.7* 5.6 5.9 5.9  HGB 11.1* 10.1* 10.7* 10.2* 10.2*  HCT 32.2* 29.3* 31.5* 29.8* 29.1*  MCV 95.5 96.7 96.6 95.2 94.5  PLT 121* 116* 132* 155 181   Cardiac Enzymes: No results for input(s): CKTOTAL, CKMB, CKMBINDEX, TROPONINI in the last 168 hours. BNP: Invalid input(s): POCBNP CBG:  Recent Labs Lab 08/19/16 1217 08/19/16 1657 08/19/16 2136 08/20/16 0722 08/20/16 1347  GLUCAP 113* 177* 152* 120* 115*   D-Dimer No results for input(s): DDIMER in the last 72 hours. Hgb A1c No results for input(s): HGBA1C in the last 72 hours. Lipid Profile No results for input(s): CHOL, HDL, LDLCALC, TRIG, CHOLHDL, LDLDIRECT in the last 72 hours. Thyroid function studies No results for input(s): TSH, T4TOTAL, T3FREE, THYROIDAB in the last 72 hours.  Invalid input(s): FREET3 Anemia work up No results for input(s): VITAMINB12, FOLATE, FERRITIN, TIBC, IRON, RETICCTPCT in the last 72 hours. Urinalysis    Component Value Date/Time   COLORURINE AMBER (A) 08/14/2016 2010   APPEARANCEUR CLEAR 08/14/2016 2010   LABSPEC 1.015 08/14/2016 2010   PHURINE 5.0 08/14/2016 2010   GLUCOSEU NEGATIVE 08/14/2016 2010   HGBUR SMALL (A) 08/14/2016 2010   BILIRUBINUR NEGATIVE 08/14/2016 2010   KETONESUR NEGATIVE 08/14/2016 2010   PROTEINUR NEGATIVE 08/14/2016 2010   NITRITE POSITIVE (A) 08/14/2016 2010   LEUKOCYTESUR NEGATIVE 08/14/2016 2010   Sepsis Labs Invalid input(s): PROCALCITONIN,  WBC,  LACTICIDVEN Microbiology Recent Results (from the past 240 hour(s))  Urine culture     Status: Abnormal   Collection Time: 08/14/16  8:10 PM  Result Value Ref Range Status   Specimen Description URINE, CLEAN CATCH  Final   Special Requests NONE  Final   Culture >=100,000 COLONIES/mL ENTEROCOCCUS FAECALIS (A)  Final   Report Status 08/17/2016 FINAL  Final   Organism ID, Bacteria ENTEROCOCCUS FAECALIS (A)  Final      Susceptibility   Enterococcus  faecalis - MIC*    AMPICILLIN <=2 SENSITIVE Sensitive     LEVOFLOXACIN 1 SENSITIVE Sensitive     NITROFURANTOIN <=16 SENSITIVE Sensitive     VANCOMYCIN 2 SENSITIVE Sensitive     * >=100,000 COLONIES/mL ENTEROCOCCUS FAECALIS  Blood Culture (routine x 2)     Status: Abnormal   Collection Time: 08/14/16  8:33 PM  Result Value Ref Range Status   Specimen Description BLOOD RIGHT ANTECUBITAL  Final   Special Requests   Final    BOTTLES DRAWN AEROBIC AND ANAEROBIC Blood Culture adequate volume   Culture  Setup Time   Final    GRAM POSITIVE COCCI IN CHAINS IN BOTH AEROBIC AND ANAEROBIC BOTTLES CRITICAL VALUE NOTED.  VALUE IS CONSISTENT WITH PREVIOUSLY REPORTED AND CALLED VALUE. Performed at [facility], [address], [place], [place] [address]    Culture ENTEROCOCCUS FAECALIS (A)  Final   Report Status 08/17/2016 FINAL  Final   Organism ID, Bacteria ENTEROCOCCUS FAECALIS  Final      Susceptibility   Enterococcus faecalis - MIC*    AMPICILLIN <=2 SENSITIVE Sensitive     VANCOMYCIN 2 SENSITIVE Sensitive     GENTAMICIN SYNERGY SENSITIVE Sensitive     * ENTEROCOCCUS FAECALIS  Blood Culture (routine x 2)     Status: Abnormal   Collection Time: 08/14/16  9:04 PM  Result Value Ref Range  Status   Specimen Description BLOOD LEFT FOREARM  Final   Special Requests   Final    BOTTLES DRAWN AEROBIC AND ANAEROBIC Blood Culture adequate volume   Culture  Setup Time   Final    GRAM POSITIVE COCCI IN CHAINS IN BOTH AEROBIC AND ANAEROBIC BOTTLES CRITICAL RESULT CALLED TO, READ BACK BY AND VERIFIED WITH: Azzie Glatter Pharm.D. 15:50 08/15/16 ([email or web address])    Culture (A)  Final    ENTEROCOCCUS FAECALIS SUSCEPTIBILITIES PERFORMED ON PREVIOUS CULTURE WITHIN THE LAST 5 DAYS. Performed at [facility], [address] [place], [place] [address]    Report Status 08/17/2016 FINAL  Final  Blood Culture ID Panel (Reflexed)     Status: Abnormal   Collection Time: 08/14/16  9:04 PM  Result Value Ref  Range Status   Enterococcus species DETECTED (A) NOT DETECTED Final    Comment: CRITICAL RESULT CALLED TO, READ BACK BY AND VERIFIED WITH: Mardee Postin.D. 15:50 08/15/16 ([email or web address])    Vancomycin resistance NOT DETECTED NOT DETECTED Final   Listeria monocytogenes NOT DETECTED NOT DETECTED Final   Staphylococcus species NOT DETECTED NOT DETECTED Final   Staphylococcus aureus NOT DETECTED NOT DETECTED Final   Streptococcus species NOT DETECTED NOT DETECTED Final   Streptococcus agalactiae NOT DETECTED NOT DETECTED Final   Streptococcus pneumoniae NOT DETECTED NOT DETECTED Final   Streptococcus pyogenes NOT DETECTED NOT DETECTED Final   Acinetobacter baumannii NOT DETECTED NOT DETECTED Final   Enterobacteriaceae species NOT DETECTED NOT DETECTED Final   Enterobacter cloacae complex NOT DETECTED NOT DETECTED Final   Escherichia coli NOT DETECTED NOT DETECTED Final   Klebsiella oxytoca NOT DETECTED NOT DETECTED Final   Klebsiella pneumoniae NOT DETECTED NOT DETECTED Final   Proteus species NOT DETECTED NOT DETECTED Final   Serratia marcescens NOT DETECTED NOT DETECTED Final   Haemophilus influenzae NOT DETECTED NOT DETECTED Final   Neisseria meningitidis NOT DETECTED NOT DETECTED Final   Pseudomonas aeruginosa NOT DETECTED NOT DETECTED Final   Candida albicans NOT DETECTED NOT DETECTED Final   Candida glabrata NOT DETECTED NOT DETECTED Final   Candida krusei NOT DETECTED NOT DETECTED Final   Candida parapsilosis NOT DETECTED NOT DETECTED Final   Candida tropicalis NOT DETECTED NOT DETECTED Final    Comment: Performed at [facility], [address] [place], [place] [address]  Culture, blood (routine x 2)     Status: None (Preliminary result)   Collection Time: 08/16/16  6:41 AM  Result Value Ref Range Status   Specimen Description BLOOD BLOOD LEFT ARM  Final   Special Requests   Final    BOTTLES DRAWN AEROBIC AND ANAEROBIC Blood Culture adequate volume   Culture   Final    NO  GROWTH 3 DAYS Performed at [facility], [address], [place], [place] [address]    Report Status PENDING  Incomplete  Culture, blood (routine x 2)     Status: None (Preliminary result)   Collection Time: 08/16/16  6:41 AM  Result Value Ref Range Status   Specimen Description BLOOD BLOOD RIGHT HAND  Final   Special Requests IN PEDIATRIC BOTTLE Blood Culture adequate volume  Final   Culture   Final    NO GROWTH 3 DAYS Performed at [facility], [address] [place], [place] [address]    Report Status PENDING  Incomplete     Time coordinating discharge: 35 minutes  SIGNED:   Glade Lloyd, MD  [facility] 08/20/2016,  4:13 PM Pager: [phone]  If 7PM-7AM, please contact night-coverage [email or web address] Password [email or web address]

## 2016-08-20 NOTE — CV Procedure (Signed)
    PROCEDURE NOTE:  Procedure:  Transesophageal echocardiogram Operator:  Armanda Magicraci Valda Christenson, MD Indications:  bacteremia Complications: None  During this procedure the patient is administered a total of Propofol 200 mg to achieve and maintain moderate conscious sedation.  The patient's heart rate, blood pressure, and oxygen saturation are monitored continuously during the procedure. Results: Normal LV size and function EF 55-60% Normal RV size and function Normal RA Normal LA and LA appendage Normal TV Normal PV Normal MV with moderate calcification of the chordae tendinae at the insertion of the anterior mitral valve leaflet. No vegetation noted. There is mild MR Normal trileaflet AV Small PFO noted by agitated saline contrast injection with marked lipomatous interatrial septum and hypermobile midsegment. Normal thoracic and ascending aorta.  The patient tolerated the procedure well and was transferred back to their room in stable condition.  Signed: Armanda Magicraci Markala Sitts, MD [facility]

## 2016-08-21 LAB — CULTURE, BLOOD (ROUTINE X 2)
CULTURE: NO GROWTH
Culture: NO GROWTH
Special Requests: ADEQUATE
Special Requests: ADEQUATE

## 2016-08-22 DIAGNOSIS — R7881 Bacteremia: Secondary | ICD-10-CM

## 2016-08-22 DIAGNOSIS — N390 Urinary tract infection, site not specified: Secondary | ICD-10-CM

## 2016-08-22 DIAGNOSIS — B952 Enterococcus as the cause of diseases classified elsewhere: Secondary | ICD-10-CM

## 2016-08-26 ENCOUNTER — Encounter: Payer: Self-pay | Admitting: Internal Medicine

## 2016-08-28 ENCOUNTER — Other Ambulatory Visit: Payer: Self-pay | Admitting: Pharmacist

## 2016-10-02 ENCOUNTER — Ambulatory Visit (INDEPENDENT_AMBULATORY_CARE_PROVIDER_SITE_OTHER): Payer: No Typology Code available for payment source | Admitting: Internal Medicine

## 2016-10-02 VITALS — BP 118/79 | HR 112 | Temp 98.9°F | Wt 214.0 lb

## 2016-10-02 DIAGNOSIS — R7881 Bacteremia: Secondary | ICD-10-CM

## 2016-10-02 DIAGNOSIS — B952 Enterococcus as the cause of diseases classified elsewhere: Secondary | ICD-10-CM

## 2016-10-02 NOTE — Progress Notes (Signed)
RFV: enterococal bacteremia Patient ID: Keith Ortiz, male   DOB: 1950-09-13, 66 y.o.   MRN: [id number]  HPI  Keith Ortiz is a 66yo M who was recently hospitalized for sepsis due to urologic procedure found to have enterococcal bacteremia. Endocarditis was ruled out by TEE and he was discharged on IV ampicillin for a total of 14 days. Since stopping his abtx, he states that he is nearly back to his baseline. He denies any further uti related symptoms. No rash, thrush, or diarrhea.  Outpatient Encounter Prescriptions as of 10/02/2016  Medication Sig  . aspirin EC 81 MG tablet Take 81 mg by mouth daily.  Marland Kitchen atorvastatin (LIPITOR) 40 MG tablet Take 40 mg by mouth daily.  Marland Kitchen ezetimibe (ZETIA) 10 MG tablet Take 10 mg by mouth daily.  . metFORMIN (GLUCOPHAGE-XR) 500 MG 24 hr tablet Take 500 mg by mouth daily.  Marland Kitchen olmesartan-hydrochlorothiazide (BENICAR HCT) 20-12.5 MG tablet Take by mouth.  Marland Kitchen acetaminophen (TYLENOL) 500 MG tablet Take 1,000 mg by mouth every 6 (six) hours as needed for mild pain.  . phenazopyridine (PYRIDIUM) 97 MG tablet Take 97 mg by mouth 2 (two) times daily as needed for pain.  . valsartan-hydrochlorothiazide (DIOVAN-HCT) 160-12.5 MG tablet Take 1 tablet by mouth daily.   No facility-administered encounter medications on file as of 10/02/2016.      Patient Active Problem List   Diagnosis Date Noted  . Enterococcal bacteremia   . UTI (urinary tract infection) due to Enterococcus   . Sepsis (HCC) 08/14/2016  . AKI (acute kidney injury) (HCC) 08/14/2016  . Type 2 diabetes mellitus without complication, without long-term current use of insulin (HCC) 08/14/2016  . Renal calculus, left 07/08/2016  . Essential hypertension 11/01/2012     Health Maintenance Due  Topic Date Due  . Hepatitis C Screening  28-Mar-1950  . PNEUMOCOCCAL POLYSACCHARIDE VACCINE (1) 01/06/1955  . FOOT EXAM  01/06/1963  . OPHTHALMOLOGY EXAM  01/06/1963  . TETANUS/TDAP  01/06/1972  . COLONOSCOPY   01/06/2003  . INFLUENZA VACCINE  08/14/2016     Review of Systems Review of Systems  Constitutional: Negative for fever, chills, diaphoresis, activity change, appetite change, fatigue and unexpected weight change.  HENT: Negative for congestion, sore throat, rhinorrhea, sneezing, trouble swallowing and sinus pressure.  Eyes: Negative for photophobia and visual disturbance.  Respiratory: Negative for cough, chest tightness, shortness of breath, wheezing and stridor.  Cardiovascular: Negative for chest pain, palpitations and leg swelling.  Gastrointestinal: Negative for nausea, vomiting, abdominal pain, diarrhea, constipation, blood in stool, abdominal distention and anal bleeding.  Genitourinary: Negative for dysuria, hematuria, flank pain and difficulty urinating.  Musculoskeletal: Negative for myalgias, back pain, joint swelling, arthralgias and gait problem.  Skin: Negative for color change, pallor, rash and wound.  Neurological: Negative for dizziness, tremors, weakness and light-headedness.  Hematological: Negative for adenopathy. Does not bruise/bleed easily.  Psychiatric/Behavioral: Negative for behavioral problems, confusion, sleep disturbance, dysphoric mood, decreased concentration and agitation.    Physical Exam   BP 118/79   Pulse (!) 112   Temp 98.9 F (37.2 C) (Oral)   Wt 214 lb (97.1 kg)   BMI 29.02 kg/m   Physical Exam  Constitutional: He is oriented to person, place, and time. He appears well-developed and well-nourished. No distress.  HENT:  Mouth/Throat: Oropharynx is clear and moist. No oropharyngeal exudate.  Cardiovascular: Normal rate, regular rhythm and normal heart sounds. Exam reveals no gallop and no friction rub.  No murmur heard.  Pulmonary/Chest: Effort  normal and breath sounds normal. No respiratory distress. He has no wheezes. Neurological: He is alert and oriented to person, place, and time.  Skin: Skin is warm and dry. No rash noted. No erythema.    Psychiatric: He has a normal mood and affect. His behavior is normal.    CBC Lab Results  Component Value Date   WBC 8.6 08/20/2016   RBC 3.08 (L) 08/20/2016   HGB 10.2 (L) 08/20/2016   HCT 29.1 (L) 08/20/2016   PLT 181 08/20/2016   MCV 94.5 08/20/2016   MCH 33.1 08/20/2016   MCHC 35.1 08/20/2016   RDW 13.5 08/20/2016   LYMPHSABS 1.6 08/20/2016   MONOABS 0.9 08/20/2016   EOSABS 0.1 08/20/2016    BMET Lab Results  Component Value Date   NA 141 08/20/2016   K 3.6 08/20/2016   CL 108 08/20/2016   CO2 25 08/20/2016   GLUCOSE 132 (H) 08/20/2016   BUN 13 08/20/2016   CREATININE 0.98 08/20/2016   CALCIUM 8.8 (L) 08/20/2016   GFRNONAA >60 08/20/2016   GFRAA >60 08/20/2016      Assessment and Plan  History of enterococcal bacteremia = has completed his antibiotics. No symptoms to suggest further need for antibiotic treatment  Health maintenance = patient is defering at this time but recommend to still get vaccine at next md visit  CC: penny jones, pcp

## 2017-10-30 IMAGING — US US RENAL
1 series · 14 of 25 positions shown · non-contrast
Comparison: CT abdomen and pelvis 07/11/2016.

CLINICAL DATA: Status post second look percutaneous left
nephrolithotomy on 07/18/2016 for a stone measuring more than 2 cm.

EXAM:
RENAL / URINARY TRACT ULTRASOUND COMPLETE

[Series 1: us renal · 0.30mm/px · 14 of 49 slices shown]
[im 1/49]
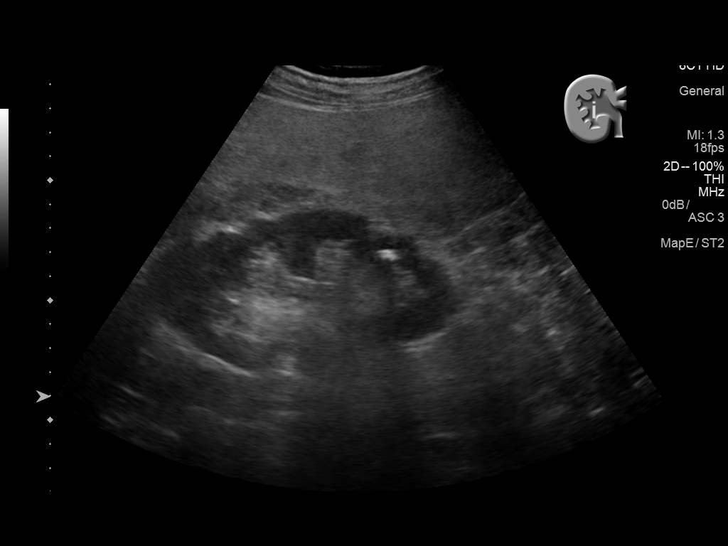
[im 5/49]
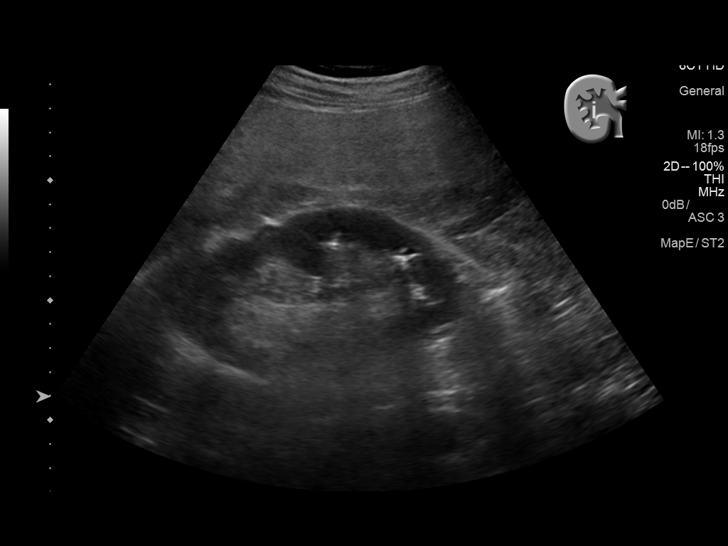
[im 9/49]
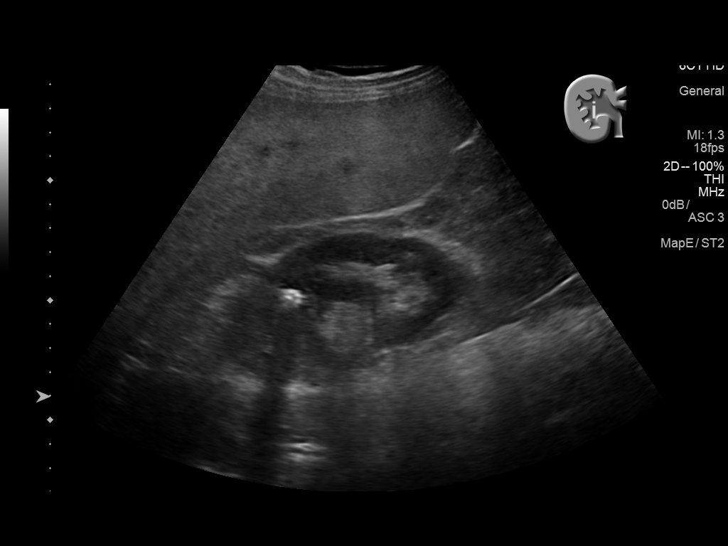
[im 13/49]
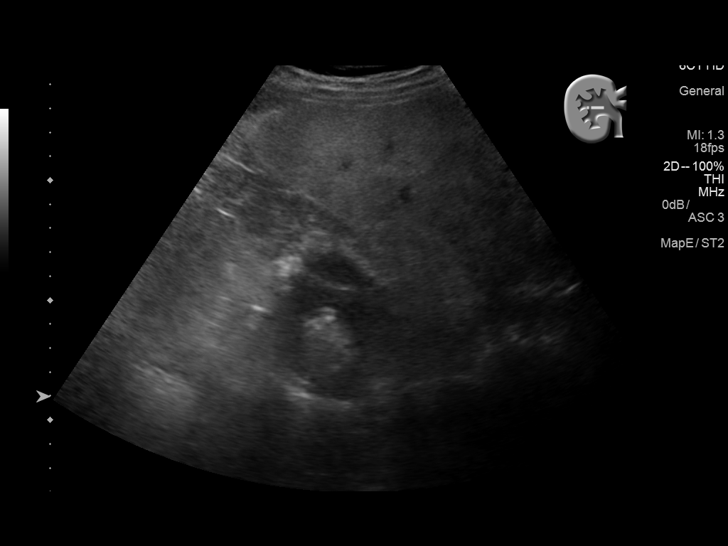
[im 17/49]
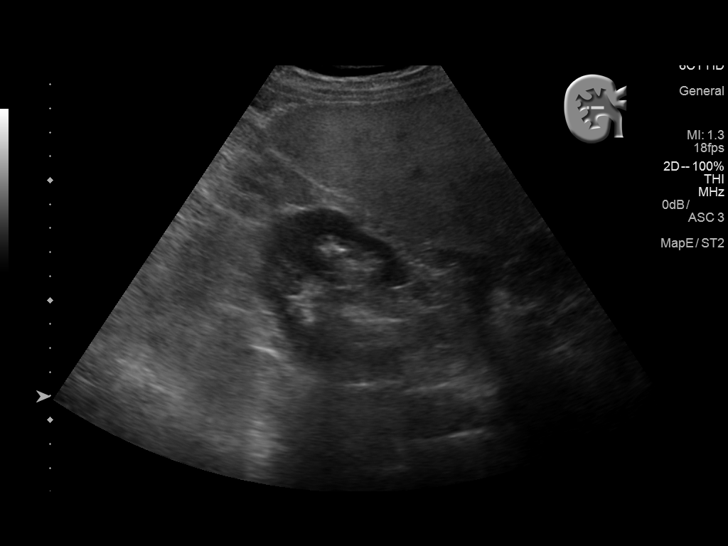
[im 19/49]
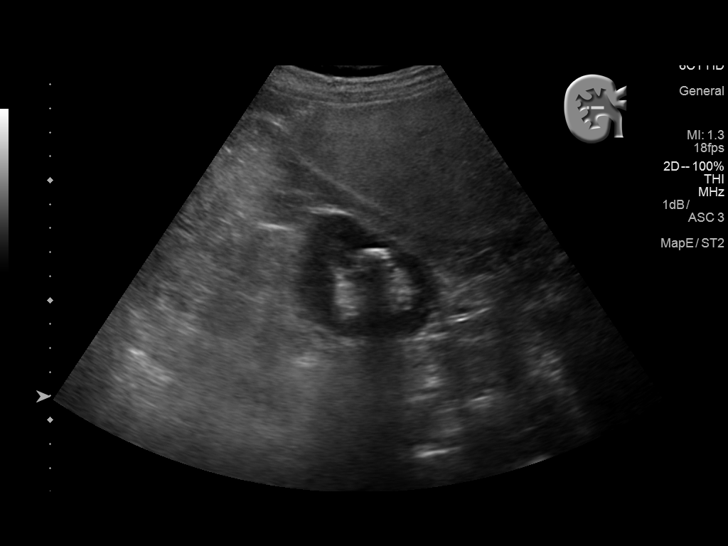
[im 23/49]
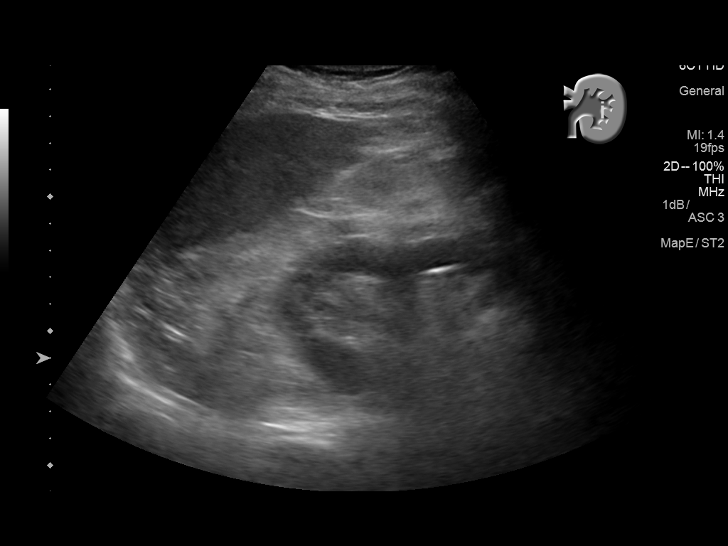
[im 27/49]
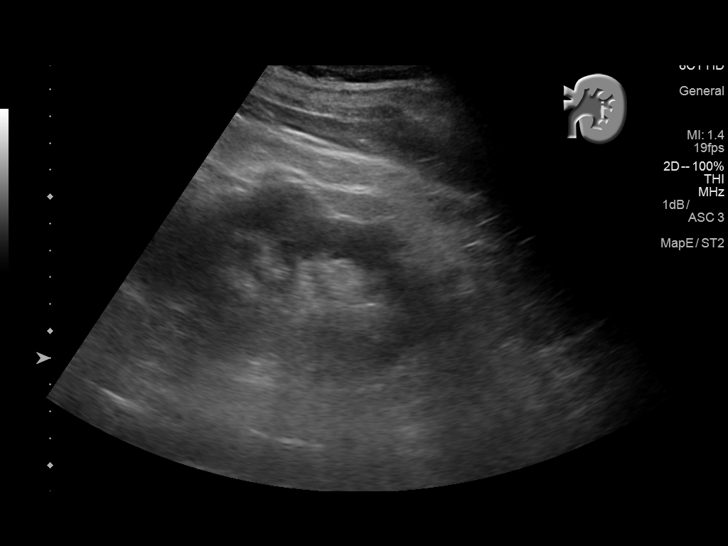
[im 31/49]
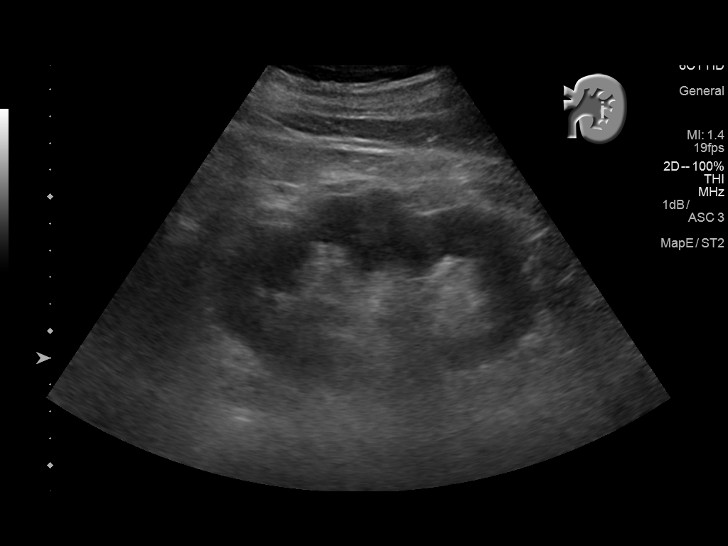
[im 33/49]
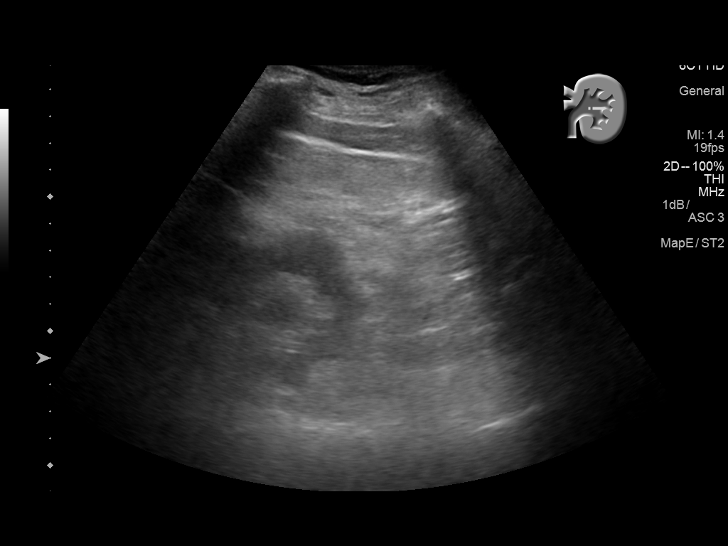
[im 37/49]
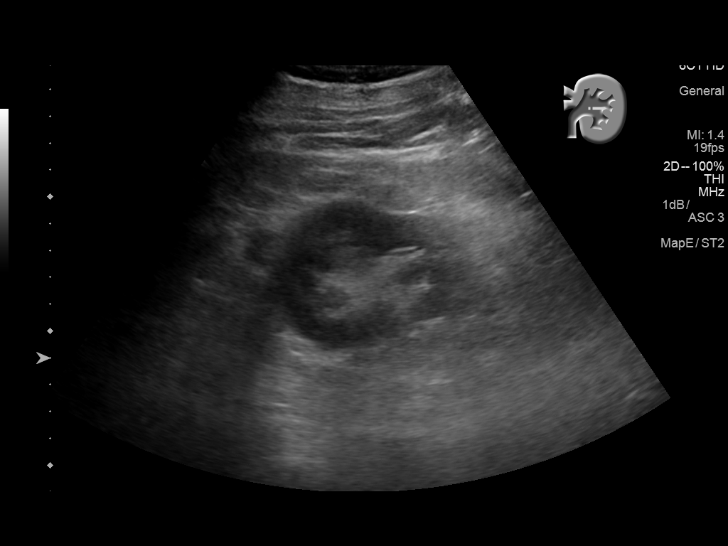
[im 41/49]
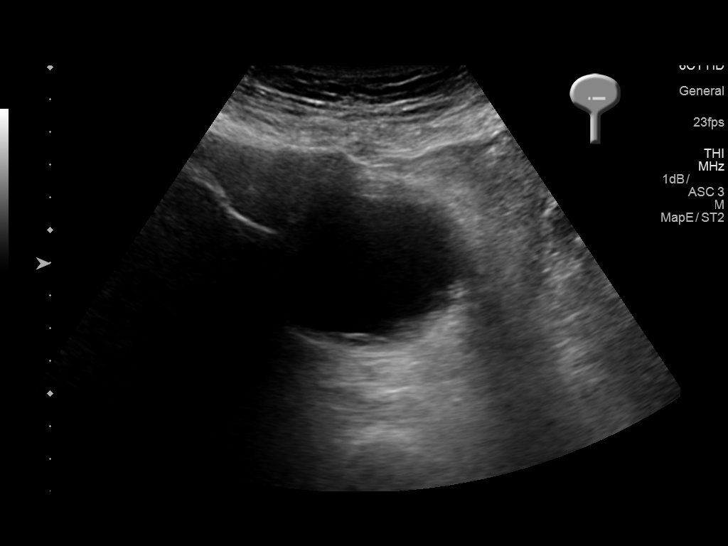
[im 45/49]
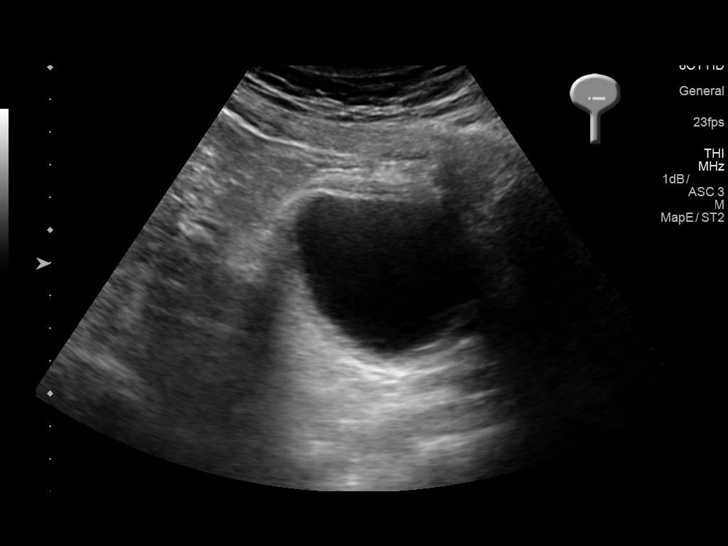
[im 49/49]
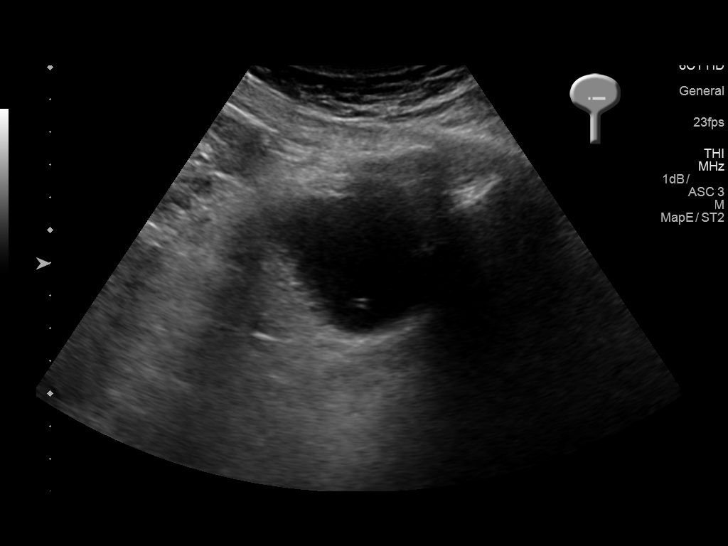

[14 of 25 positions shown; findings below may reference images not displayed]

FINDINGS: Right Kidney:

Length: 12.4 cm. Echogenicity within normal limits. No mass or
hydronephrosis visualized. Shadowing stones are identified measuring
up to 1.2 cm.

Left Kidney:

Length: 12.1 cm.  Stent is in place.  No hydronephrosis.  No mass.

Bladder:

Appears normal for degree of bladder distention.  Stent is noted.
IMPRESSION: Negative for hydronephrosis with a left double-J ureteral stent in
place. No stones are seen on the left.

Nonobstructing right renal stones.

## 2017-11-19 IMAGING — DX DG FOOT 2V*L*
2 series · 2 of 2 positions shown · non-contrast
Comparison: None.

CLINICAL DATA: Left foot/ankle pain.

EXAM:
LEFT FOOT - 2 VIEW

[foot ap]
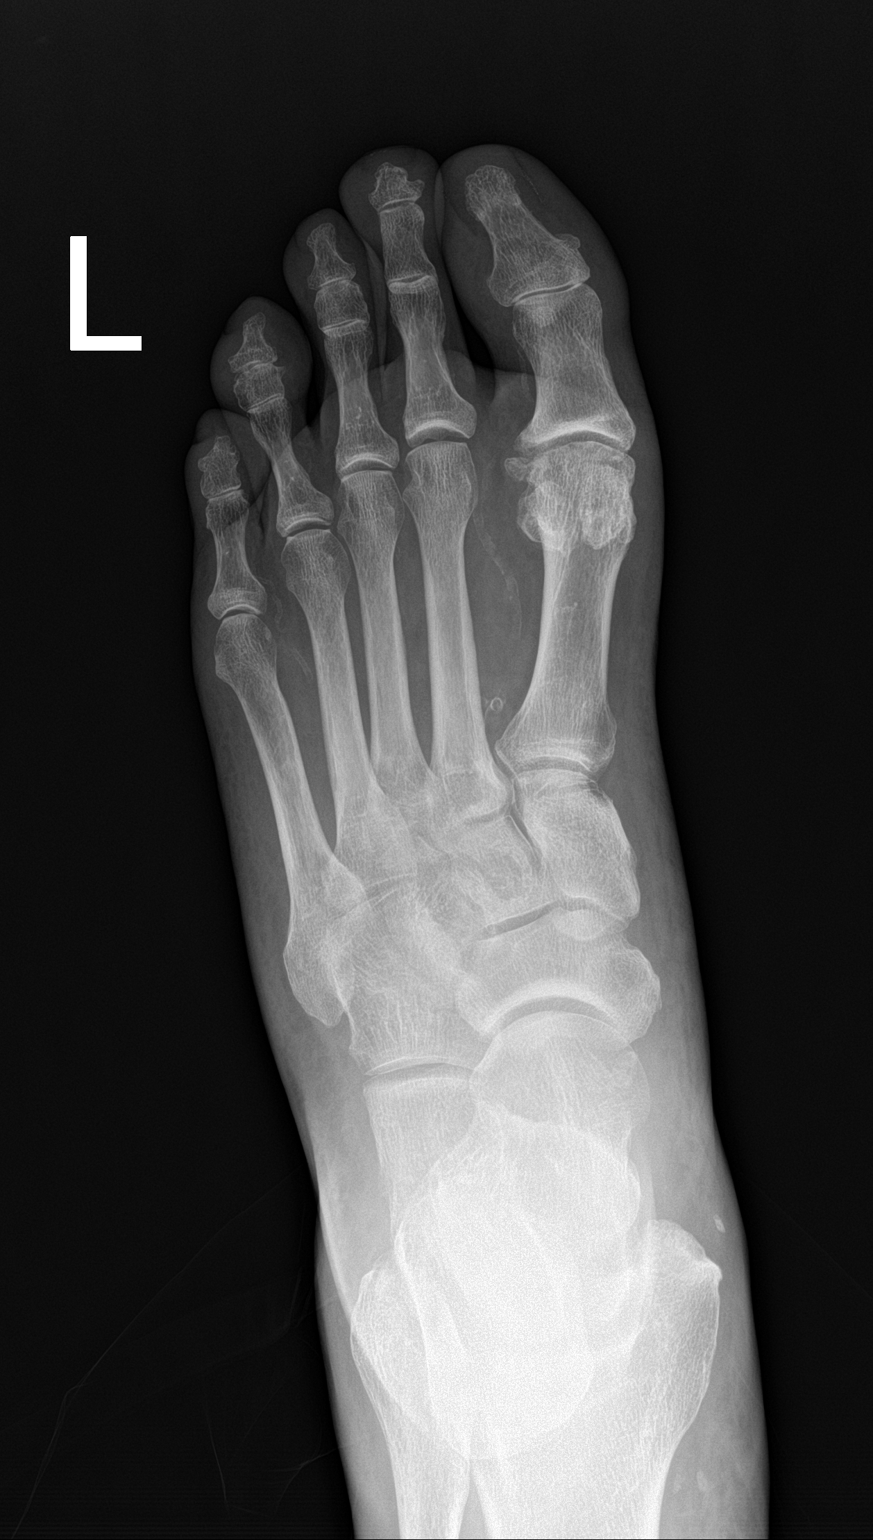

[foot lat]
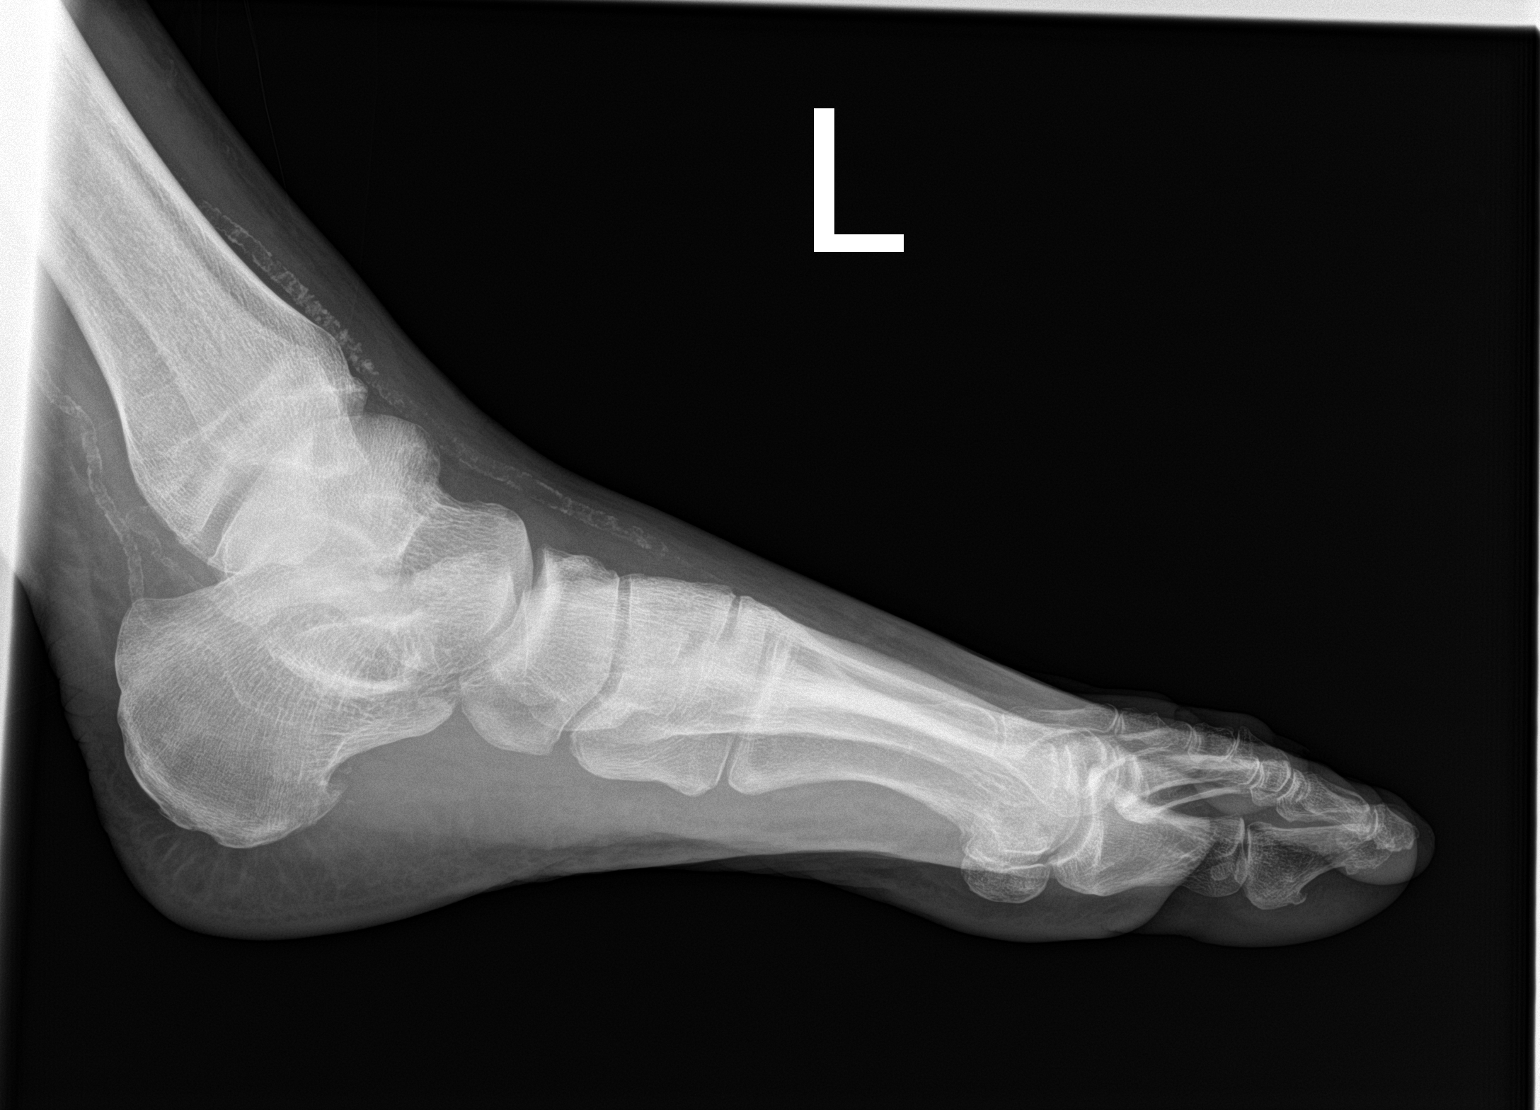

[2 of 2 positions shown; findings below may reference images not displayed]

FINDINGS: There is a mild hallux valgus deformity identified. Do there are
secondary degenerative changes noted at the first MTP joint. Small
plantar heel spur. Vascular calcifications identified.
IMPRESSION: 1. No acute findings.
2. First MTP joint osteoarthritis.

## 2022-05-20 ENCOUNTER — Emergency Department (HOSPITAL_COMMUNITY): Payer: No Typology Code available for payment source

## 2022-05-20 ENCOUNTER — Encounter (HOSPITAL_COMMUNITY): Payer: Self-pay | Admitting: *Deleted

## 2022-05-20 ENCOUNTER — Other Ambulatory Visit: Payer: Self-pay

## 2022-05-20 ENCOUNTER — Emergency Department (HOSPITAL_COMMUNITY)
Admission: EM | Admit: 2022-05-20 | Discharge: 2022-05-20 | Disposition: A | Discharge status: Home / Self Care | Payer: No Typology Code available for payment source | Attending: Student | Admitting: Student

## 2022-05-20 DIAGNOSIS — M25511 Pain in right shoulder: Secondary | ICD-10-CM | POA: Diagnosis not present

## 2022-05-20 DIAGNOSIS — E119 Type 2 diabetes mellitus without complications: Secondary | ICD-10-CM | POA: Diagnosis not present

## 2022-05-20 DIAGNOSIS — I10 Essential (primary) hypertension: Secondary | ICD-10-CM | POA: Insufficient documentation

## 2022-05-20 DIAGNOSIS — Z7984 Long term (current) use of oral hypoglycemic drugs: Secondary | ICD-10-CM | POA: Insufficient documentation

## 2022-05-20 DIAGNOSIS — Z7982 Long term (current) use of aspirin: Secondary | ICD-10-CM | POA: Insufficient documentation

## 2022-05-20 LAB — CBC WITH DIFFERENTIAL/PLATELET
Abs Immature Granulocytes: 0.09 10*3/uL — ABNORMAL HIGH (ref 0.00–0.07)
Basophils Absolute: 0 10*3/uL (ref 0.0–0.1)
Basophils Relative: 0 %
Eosinophils Absolute: 0 10*3/uL (ref 0.0–0.5)
Eosinophils Relative: 0 %
HCT: 41.9 % (ref 39.0–52.0)
Hemoglobin: 14.9 g/dL (ref 13.0–17.0)
Immature Granulocytes: 1 %
Lymphocytes Relative: 16 %
Lymphs Abs: 1.5 10*3/uL (ref 0.7–4.0)
MCH: 33.7 pg (ref 26.0–34.0)
MCHC: 35.6 g/dL (ref 30.0–36.0)
MCV: 94.8 fL (ref 80.0–100.0)
Monocytes Absolute: 0.7 10*3/uL (ref 0.1–1.0)
Monocytes Relative: 8 %
Neutro Abs: 7 10*3/uL (ref 1.7–7.7)
Neutrophils Relative %: 75 %
Platelets: 271 10*3/uL (ref 150–400)
RBC: 4.42 MIL/uL (ref 4.22–5.81)
RDW: 13.2 % (ref 11.5–15.5)
WBC: 9.3 10*3/uL (ref 4.0–10.5)
nRBC: 0 % (ref 0.0–0.2)

## 2022-05-20 LAB — BASIC METABOLIC PANEL
Anion gap: 13 (ref 5–15)
BUN: 12 mg/dL (ref 8–23)
CO2: 20 mmol/L — ABNORMAL LOW (ref 22–32)
Calcium: 9.9 mg/dL (ref 8.9–10.3)
Chloride: 99 mmol/L (ref 98–111)
Creatinine, Ser: 1.08 mg/dL (ref 0.61–1.24)
GFR, Estimated: >60 mL/min (ref >60)
Glucose, Bld: 150 mg/dL — ABNORMAL HIGH (ref 70–99)
Potassium: 4.5 mmol/L (ref 3.5–5.1)
Sodium: 132 mmol/L — ABNORMAL LOW (ref 135–145)

## 2022-05-20 LAB — MAGNESIUM: Magnesium: 1.6 mg/dL — ABNORMAL LOW (ref 1.7–2.4)

## 2022-05-20 LAB — BRAIN NATRIURETIC PEPTIDE: B Natriuretic Peptide: 36.9 pg/mL (ref 0.0–100.0)

## 2022-05-20 LAB — TROPONIN I (HIGH SENSITIVITY): Troponin I (High Sensitivity): 8 ng/L (ref <18)

## 2022-05-20 MED ORDER — NAPROXEN 375 MG PO TABS: 375.0000 mg | ORAL_TABLET | Freq: Two times a day (BID) | ORAL | 0 refills | Status: DC

## 2022-05-20 MED ORDER — KETOROLAC TROMETHAMINE 15 MG/ML IJ SOLN
15.0000 mg | Freq: Once | INTRAMUSCULAR | Status: AC
  Administered 2022-05-20: 15 mg via INTRAMUSCULAR
  Filled 2022-05-20: qty 1

## 2022-05-20 MED ORDER — LIDOCAINE 5 % EX PTCH
1.0000 | MEDICATED_PATCH | CUTANEOUS | Status: DC
  Administered 2022-05-20: 1 via TRANSDERMAL
  Filled 2022-05-20: qty 1

## 2022-05-20 MED ORDER — LIDOCAINE 5 % EX PTCH: 1.0000 | MEDICATED_PATCH | CUTANEOUS | 0 refills | Status: DC

## 2022-05-20 NOTE — ED Triage Notes (Signed)
Patient presents to ed c/o right shoulder pain onset 2 weeks ago, /co sob onset 1 week ago, states she was seen by his PCP on Wed and told prob. Muscular was given a muscle relaxer states last 2 days pain is much worse.

## 2022-05-20 NOTE — Discharge Instructions (Signed)
For pain:  - Acetaminophen 1000 mg three times daily (every 8 hours) - Naproxen 2 times daily (every 12 hours) - lidoderm patches twice daily

## 2022-05-20 NOTE — ED Provider Notes (Signed)
[place] EMERGENCY DEPARTMENT AT [facility] Provider Note  CSN: [id number] Arrival date & time: 05/20/22 2951  Chief Complaint(s) Shoulder Pain  HPI Keith Ortiz is a 72 y.o. male with PMH T2DM, HLD, HTN who presents emergency room for evaluation of right shoulder pain.  Patient was picking up a large musical drum 2 weeks ago and has had persistent right shoulder pain since.  He saw his primary care physician who placed him on muscle relaxers but these have not relieved his pain.  He comes to the emergency department today due to concern for cardiac pathology given his strong family history of cardiac disease.  He endorses some mild shortness of breath that he states is secondary to the pain in his right shoulder and he can relieve his shortness of breath with specific positions of his shoulder.  Denies associated nausea, vomiting, diaphoresis or any exertional component to the patient's pain.   Past Medical History Past Medical History:  Diagnosis Date   Arthritis    Diabetes mellitus without complication (HCC)    type 2   History of kidney stones    HLD (hyperlipidemia)    Hypertension    Pneumonia    Patient Active Problem List   Diagnosis Date Noted   Enterococcal bacteremia    UTI (urinary tract infection) due to Enterococcus    Sepsis (HCC) 08/14/2016   AKI (acute kidney injury) (HCC) 08/14/2016   Type 2 diabetes mellitus without complication, without long-term current use of insulin (HCC) 08/14/2016   Renal calculus, left 07/08/2016   Essential hypertension 11/01/2012   Home Medication(s) Prior to Admission medications   Medication Sig Start Date End Date Taking? Authorizing Provider  lidocaine (LIDODERM) 5 % Place 1 patch onto the skin daily. Remove & Discard patch within 12 hours or as directed by MD 05/20/22  Yes Silvia Hightower, MD  naproxen (NAPROSYN) 375 MG tablet Take 1 tablet (375 mg total) by mouth 2 (two) times daily. 05/20/22  Yes Amiir Heckard, MD   acetaminophen (TYLENOL) 500 MG tablet Take 1,000 mg by mouth every 6 (six) hours as needed for mild pain.    [provider]  aspirin EC 81 MG tablet Take 81 mg by mouth daily.    [provider]  atorvastatin (LIPITOR) 40 MG tablet Take 40 mg by mouth daily. 06/05/16   [provider]  ezetimibe (ZETIA) 10 MG tablet Take 10 mg by mouth daily. 06/05/16   [provider]  metFORMIN (GLUCOPHAGE-XR) 500 MG 24 hr tablet Take 500 mg by mouth daily. 06/11/16   [provider]  olmesartan-hydrochlorothiazide (BENICAR HCT) 20-12.5 MG tablet Take by mouth. 09/05/16 09/05/17  [provider]  phenazopyridine (PYRIDIUM) 97 MG tablet Take 97 mg by mouth 2 (two) times daily as needed for pain.    [provider]  valsartan-hydrochlorothiazide (DIOVAN-HCT) 160-12.5 MG tablet Take 1 tablet by mouth daily. 06/05/16   [provider]  Past Surgical History Past Surgical History:  Procedure Laterality Date   BLADDER STONE REMOVAL     CYSTOSCOPY WITH STENT PLACEMENT Left 07/08/2016   Procedure: CYSTOSCOPY WITH STENT PLACEMENT;  Surgeon: Malen Gauze, MD;  Location: [facility];  Service: Urology;  Laterality: Left;   FRACTURE SURGERY     HERNIA REPAIR     LITHOTRIPSY     NEPHROLITHOTOMY Left 07/08/2016   Procedure: NEPHROLITHOTOMY PERCUTANEOUS;  Surgeon: Malen Gauze, MD;  Location: [facility];  Service: Urology;  Laterality: Left;   NEPHROLITHOTOMY Left 07/18/2016   Procedure: NEPHROLITHOTOMY PERCUTANEOUS SECOND LOOK;  Surgeon: Malen Gauze, MD;  Location: [facility];  Service: Urology;  Laterality: Left;   TEE WITHOUT CARDIOVERSION N/A 08/20/2016   Procedure: TRANSESOPHAGEAL ECHOCARDIOGRAM (TEE);  Surgeon: Quintella Reichert, MD;  Location: [facility] END[facility];  Service: Cardiovascular;  Laterality: N/A;   TONSILLECTOMY      Family History Family History  Problem Relation Age of Onset   Dementia Mother    Hemochromatosis Brother     Social History Social History   Tobacco Use   Smoking status: Never   Smokeless tobacco: Never  Substance Use Topics   Alcohol use: Not Currently    Comment: occasionally   Drug use: No   Allergies Patient has no known allergies.  Review of Systems Review of Systems  Respiratory:  Positive for shortness of breath.   Musculoskeletal:  Positive for arthralgias.    Physical Exam Vital Signs  I have reviewed the triage vital signs BP 104/68 (BP Location: Right Arm)   Pulse (!) 104   Temp 98.1 F (36.7 C) (Oral)   Resp 17   Ht 6' (1.829 m)   Wt 83.9 kg   SpO2 99%   BMI 25.09 kg/m   Physical Exam Constitutional:      General: He is not in acute distress.    Appearance: Normal appearance.  HENT:     Head: Normocephalic and atraumatic.     Nose: No congestion or rhinorrhea.  Eyes:     General:        Right eye: No discharge.        Left eye: No discharge.     Extraocular Movements: Extraocular movements intact.     Pupils: Pupils are equal, round, and reactive to light.  Cardiovascular:     Rate and Rhythm: Normal rate and regular rhythm.     Heart sounds: No murmur heard. Pulmonary:     Effort: No respiratory distress.     Breath sounds: No wheezing or rales.  Abdominal:     General: There is no distension.     Tenderness: There is no abdominal tenderness.  Musculoskeletal:        General: Tenderness present. Normal range of motion.     Cervical back: Normal range of motion.  Skin:    General: Skin is warm and dry.  Neurological:     General: No focal deficit present.     Mental Status: He is alert.     ED Results and Treatments Labs (all labs ordered are listed, but only abnormal results are displayed) Labs Reviewed  BASIC METABOLIC PANEL - Abnormal; Notable for the following components:      Result Value   Sodium 132 (*)    CO2  20 (*)    Glucose, Bld 150 (*)    All other components within normal limits  CBC WITH DIFFERENTIAL/PLATELET - Abnormal; Notable for the following components:   Abs  Immature Granulocytes 0.09 (*)    All other components within normal limits  MAGNESIUM - Abnormal; Notable for the following components:   Magnesium 1.6 (*)    All other components within normal limits  RESP PANEL BY RT-PCR (RSV, FLU A&B, COVID)  RVPGX2  BRAIN NATRIURETIC PEPTIDE  TROPONIN I (HIGH SENSITIVITY)  TROPONIN I (HIGH SENSITIVITY)                                                                                                                          Radiology DG Chest 2 View  Result Date: 05/20/2022 CLINICAL DATA:  Dyspnea and shoulder pain. EXAM: CHEST - 2 VIEW COMPARISON:  07/22/2016 FINDINGS: Heart size is normal. No pleural effusion or edema. No airspace opacities identified. Chronic interstitial coarsening noted bilaterally. IMPRESSION: Chronic interstitial coarsening. No acute findings. Electronically Signed   By: Signa Kell M.D.   On: 05/20/2022 06:27    Pertinent labs & imaging results that were available during my care of the patient were reviewed by me and considered in my medical decision making (see MDM for details).  Medications Ordered in ED Medications  lidocaine (LIDODERM) 5 % 1 patch (1 patch Transdermal Patch Applied 05/20/22 1010)  ketorolac (TORADOL) 15 MG/ML injection 15 mg (15 mg Intramuscular Given 05/20/22 1010)                                                                                                                                     Procedures Procedures  (including critical care time)  Medical Decision Making / ED Course   This patient presents to the ED for concern of shoulder pain, shortness of breath, this involves an extensive number of treatment options, and is a complaint that carries with it a high risk of complications and morbidity.  The differential diagnosis includes  musculoskeletal strain, fracture, mass, lymphadenopathy, ACS, rotator cuff injury  MDM: Patient seen emergency room for evaluation of shoulder pain and shortness of breath.  Physical exam with reproducible tenderness at the insertion point of the pectoralis muscle on the humerus and over the anterior pectoralis.  Laboratory evaluation with mild hyponatremia 132, magnesium 1.6 but is otherwise unremarkable.  High-sensitivity troponin reassuringly negative.  ECG nonischemic.  Chest x-ray without evidence of infection or mass.  Patient low risk by Wells criteria and I have low suspicion for PE.  Given recent traumatic injury with lifting  a heavy drum and symptoms immediately preceding this, reproducible symptoms with palpation, lower suspicion for ACS today and patient presentation consistent with skeletal strain.  Patient given Toradol and Lidoderm and on reevaluation symptoms improved.  Patient will start on a pain regimen including Tylenol, Naprosyn and Lidoderm.  Patient cautioned against the use of muscle relaxers as antihistamines like Flexeril do not inherently treat pain and should only be used at night as they can cause significant sedation.  Patient then discharged with outpatient follow-up   Additional history obtained: -Additional history obtained from wife -External records from outside source obtained and reviewed including: Chart review including previous notes, labs, imaging, consultation notes   Lab Tests: -I ordered, reviewed, and interpreted labs.   The pertinent results include:   Labs Reviewed  BASIC METABOLIC PANEL - Abnormal; Notable for the following components:      Result Value   Sodium 132 (*)    CO2 20 (*)    Glucose, Bld 150 (*)    All other components within normal limits  CBC WITH DIFFERENTIAL/PLATELET - Abnormal; Notable for the following components:   Abs Immature Granulocytes 0.09 (*)    All other components within normal limits  MAGNESIUM - Abnormal; Notable  for the following components:   Magnesium 1.6 (*)    All other components within normal limits  RESP PANEL BY RT-PCR (RSV, FLU A&B, COVID)  RVPGX2  BRAIN NATRIURETIC PEPTIDE  TROPONIN I (HIGH SENSITIVITY)  TROPONIN I (HIGH SENSITIVITY)      EKG   EKG Interpretation  Date/Time:  Monday May 20 2022 06:03:54 EDT Ventricular Rate:  111 PR Interval:  130 QRS Duration: 76 QT Interval:  316 QTC Calculation: 429 R Axis:   61 Text Interpretation: Sinus tachycardia Otherwise normal ECG When compared with ECG of 14-Aug-2016 20:53, PREVIOUS ECG IS PRESENT Confirmed by Jadarius Commons ([id number]) on 05/20/2022 12:22:12 PM         Imaging Studies ordered: I ordered imaging studies including chest x-ray I independently visualized and interpreted imaging. I agree with the radiologist interpretation   Medicines ordered and prescription drug management: Meds ordered this encounter  Medications   ketorolac (TORADOL) 15 MG/ML injection 15 mg   lidocaine (LIDODERM) 5 % 1 patch   naproxen (NAPROSYN) 375 MG tablet    Sig: Take 1 tablet (375 mg total) by mouth 2 (two) times daily.    Dispense:  20 tablet    Refill:  0   lidocaine (LIDODERM) 5 %    Sig: Place 1 patch onto the skin daily. Remove & Discard patch within 12 hours or as directed by MD    Dispense:  30 patch    Refill:  0    -I have reviewed the patients home medicines and have made adjustments as needed  Critical interventions none    Cardiac Monitoring: The patient was maintained on a cardiac monitor.  I personally viewed and interpreted the cardiac monitored which showed an underlying rhythm of: NSR, sinus tachycardia  Social Determinants of Health:  Factors impacting patients care include: none   Reevaluation: After the interventions noted above, I reevaluated the patient and found that they have :improved  Co morbidities that complicate the patient evaluation  Past Medical History:  Diagnosis Date   Arthritis     Diabetes mellitus without complication (HCC)    type 2   History of kidney stones    HLD (hyperlipidemia)    Hypertension    Pneumonia  Dispostion: I considered admission for this patient, but at this time he does not meet inpatient criteria for admission he is safe for discharge with outpatient follow-up     Final Clinical Impression(s) / ED Diagnoses Final diagnoses:  Acute pain of right shoulder     @PCDICTATION @    Nichoel Digiulio, Wyn Forster, MD 05/20/22 1344

## 2022-05-20 NOTE — ED Provider Triage Note (Signed)
Emergency Medicine Provider Triage Evaluation Note  Keith Ortiz , a 72 y.o. male  was evaluated in triage.  Pt complains of pain around the right shoulder blade.  This initially started a couple weeks ago however has not improved.  Progressively worsening shortness of breath particularly with exertion.  No swelling of the lower legs.  Outside of the pain in the right shoulder blade he denies any chest pain.  Review of Systems  Positive: As above Negative: As above  Physical Exam  BP 129/78 (BP Location: Right Arm)   Pulse (!) 109   Temp 98.2 F (36.8 C) (Oral)   Resp (!) 21   SpO2 98%  Gen:   Awake, no distress   Resp:  Normal effort  MSK:   Moves extremities without difficulty  Other:    Medical Decision Making  Medically screening exam initiated at 6:02 AM.  Appropriate orders placed.  LENOXX CASSADA was informed that the remainder of the evaluation will be completed by another provider, this initial triage assessment does not replace that evaluation, and the importance of remaining in the ED until their evaluation is complete.     Marita Kansas, PA-C 05/20/22 [phone]

## 2022-07-15 DEATH — deceased
